# Patient Record
Sex: Female | Born: 1970 | Race: White | Hispanic: No | Marital: Married | Smoking: Never smoker
Health system: Southern US, Community
[De-identification: ages and names within clinical notes are randomized; demographics above are authoritative.]

## PROBLEM LIST (undated history)

## (undated) DIAGNOSIS — G459 Transient cerebral ischemic attack, unspecified: Secondary | ICD-10-CM

## (undated) HISTORY — PX: NO PAST SURGERIES: SHX2092

---

## 2018-09-26 ENCOUNTER — Observation Stay (HOSPITAL_COMMUNITY): Payer: PRIVATE HEALTH INSURANCE

## 2018-09-26 ENCOUNTER — Emergency Department (HOSPITAL_COMMUNITY): Payer: PRIVATE HEALTH INSURANCE

## 2018-09-26 ENCOUNTER — Other Ambulatory Visit: Payer: Self-pay

## 2018-09-26 ENCOUNTER — Inpatient Hospital Stay (HOSPITAL_COMMUNITY)
Admission: EM | Admit: 2018-09-26 | Discharge: 2018-09-28 | DRG: 065 | Disposition: A | Discharge status: Home / Self Care | Payer: PRIVATE HEALTH INSURANCE | Attending: Internal Medicine | Admitting: Internal Medicine

## 2018-09-26 ENCOUNTER — Encounter (HOSPITAL_COMMUNITY): Payer: Self-pay | Admitting: Emergency Medicine

## 2018-09-26 DIAGNOSIS — I6310 Cerebral infarction due to embolism of unspecified precerebral artery: Secondary | ICD-10-CM

## 2018-09-26 DIAGNOSIS — R29702 NIHSS score 2: Secondary | ICD-10-CM | POA: Diagnosis present

## 2018-09-26 DIAGNOSIS — I6340 Cerebral infarction due to embolism of unspecified cerebral artery: Secondary | ICD-10-CM | POA: Diagnosis not present

## 2018-09-26 DIAGNOSIS — Z833 Family history of diabetes mellitus: Secondary | ICD-10-CM

## 2018-09-26 DIAGNOSIS — Q2112 Patent foramen ovale: Secondary | ICD-10-CM

## 2018-09-26 DIAGNOSIS — R29810 Facial weakness: Secondary | ICD-10-CM | POA: Diagnosis present

## 2018-09-26 DIAGNOSIS — R4781 Slurred speech: Secondary | ICD-10-CM | POA: Diagnosis present

## 2018-09-26 DIAGNOSIS — I6782 Cerebral ischemia: Secondary | ICD-10-CM | POA: Diagnosis present

## 2018-09-26 DIAGNOSIS — E785 Hyperlipidemia, unspecified: Secondary | ICD-10-CM | POA: Diagnosis present

## 2018-09-26 DIAGNOSIS — R202 Paresthesia of skin: Secondary | ICD-10-CM | POA: Diagnosis not present

## 2018-09-26 DIAGNOSIS — R200 Anesthesia of skin: Secondary | ICD-10-CM | POA: Diagnosis not present

## 2018-09-26 DIAGNOSIS — Q211 Atrial septal defect: Secondary | ICD-10-CM

## 2018-09-26 DIAGNOSIS — G8191 Hemiplegia, unspecified affecting right dominant side: Secondary | ICD-10-CM | POA: Diagnosis present

## 2018-09-26 DIAGNOSIS — G459 Transient cerebral ischemic attack, unspecified: Secondary | ICD-10-CM | POA: Diagnosis present

## 2018-09-26 DIAGNOSIS — E669 Obesity, unspecified: Secondary | ICD-10-CM | POA: Diagnosis not present

## 2018-09-26 DIAGNOSIS — Z6836 Body mass index (BMI) 36.0-36.9, adult: Secondary | ICD-10-CM

## 2018-09-26 DIAGNOSIS — I639 Cerebral infarction, unspecified: Secondary | ICD-10-CM | POA: Diagnosis present

## 2018-09-26 HISTORY — DX: Transient cerebral ischemic attack, unspecified: G45.9

## 2018-09-26 LAB — COMPREHENSIVE METABOLIC PANEL
ALT: 14 U/L (ref 0–44)
AST: 17 U/L (ref 15–41)
Albumin: 3.9 g/dL (ref 3.5–5.0)
Alkaline Phosphatase: 90 U/L (ref 38–126)
Anion gap: 8 (ref 5–15)
BUN: 12 mg/dL (ref 6–20)
CO2: 23 mmol/L (ref 22–32)
Calcium: 8.5 mg/dL — ABNORMAL LOW (ref 8.9–10.3)
Chloride: 107 mmol/L (ref 98–111)
Creatinine, Ser: 0.64 mg/dL (ref 0.44–1.00)
GFR calc Af Amer: >60 mL/min (ref >60)
GFR calc non Af Amer: >60 mL/min (ref >60)
GLUCOSE: 86 mg/dL (ref 70–99)
Potassium: 3.9 mmol/L (ref 3.5–5.1)
Sodium: 138 mmol/L (ref 135–145)
Total Bilirubin: 0.2 mg/dL — ABNORMAL LOW (ref 0.3–1.2)
Total Protein: 7.5 g/dL (ref 6.5–8.1)

## 2018-09-26 LAB — CBC WITH DIFFERENTIAL/PLATELET
Abs Immature Granulocytes: 0.06 10*3/uL (ref 0.00–0.07)
Basophils Absolute: 0.1 10*3/uL (ref 0.0–0.1)
Basophils Relative: 1 %
EOS ABS: 0.1 10*3/uL (ref 0.0–0.5)
Eosinophils Relative: 1 %
HCT: 40 % (ref 36.0–46.0)
Hemoglobin: 12.3 g/dL (ref 12.0–15.0)
Immature Granulocytes: 1 %
Lymphocytes Relative: 30 %
Lymphs Abs: 3.1 10*3/uL (ref 0.7–4.0)
MCH: 27.6 pg (ref 26.0–34.0)
MCHC: 30.8 g/dL (ref 30.0–36.0)
MCV: 89.7 fL (ref 80.0–100.0)
Monocytes Absolute: 0.5 10*3/uL (ref 0.1–1.0)
Monocytes Relative: 5 %
Neutro Abs: 6.6 10*3/uL (ref 1.7–7.7)
Neutrophils Relative %: 62 %
Platelets: 417 10*3/uL — ABNORMAL HIGH (ref 150–400)
RBC: 4.46 MIL/uL (ref 3.87–5.11)
RDW: 14 % (ref 11.5–15.5)
WBC: 10.4 10*3/uL (ref 4.0–10.5)
nRBC: 0 % (ref 0.0–0.2)

## 2018-09-26 LAB — TSH: TSH: 1.885 u[IU]/mL (ref 0.350–4.500)

## 2018-09-26 MED ORDER — ACETAMINOPHEN 325 MG PO TABS: 650.0000 mg | ORAL_TABLET | ORAL | Status: DC | PRN

## 2018-09-26 MED ORDER — ASPIRIN 325 MG PO TABS
325.0000 mg | ORAL_TABLET | Freq: Every day | ORAL | Status: DC
  Administered 2018-09-26 – 2018-09-27 (×2): 325 mg via ORAL
  Filled 2018-09-26 (×2): qty 1

## 2018-09-26 MED ORDER — SENNOSIDES-DOCUSATE SODIUM 8.6-50 MG PO TABS: 1.0000 | ORAL_TABLET | Freq: Every evening | ORAL | Status: DC | PRN

## 2018-09-26 MED ORDER — ACETAMINOPHEN 160 MG/5ML PO SOLN: 650.0000 mg | ORAL | Status: DC | PRN

## 2018-09-26 MED ORDER — ACETAMINOPHEN 650 MG RE SUPP: 650.0000 mg | RECTAL | Status: DC | PRN

## 2018-09-26 MED ORDER — ENOXAPARIN SODIUM 40 MG/0.4ML ~~LOC~~ SOLN
40.0000 mg | SUBCUTANEOUS | Status: DC
  Administered 2018-09-27: 40 mg via SUBCUTANEOUS
  Filled 2018-09-26 (×2): qty 0.4

## 2018-09-26 MED ORDER — ATORVASTATIN CALCIUM 40 MG PO TABS
40.0000 mg | ORAL_TABLET | Freq: Every day | ORAL | Status: DC
  Administered 2018-09-27 – 2018-09-28 (×2): 40 mg via ORAL
  Filled 2018-09-26 (×2): qty 1

## 2018-09-26 MED ORDER — STROKE: EARLY STAGES OF RECOVERY BOOK
Freq: Once | Status: AC
  Administered 2018-09-27: 05:00:00

## 2018-09-26 NOTE — ED Triage Notes (Signed)
Patient states started having some slurred speech and difficult forming her words last night around 10:30pm. Per patient woke this morning at 8:30-9:00 with numbness and tingling to right side of body. Patient denies weakness but states "Feels like my arm and leg are asleep." Patient ambulates and gait steady. Patient does Drag right foot with ambulation. Slight right side face drooping noted. Patient reports hx of TIA that affected left hand that had similar symptoms.

## 2018-09-26 NOTE — H&P (Signed)
TRH H&P   Patient Demographics:    Kristie Sweeney, is a 48 y.o. female  MRN: [id number]   DOB - 1971/06/20  Admit Date - 09/26/2018  Outpatient Primary MD for the patient is Patient, No Pcp Per  Referring MD: Dr. Adriana Simas  Outpatient Specialists: None  Patient coming from: Home  Chief Complaint  Patient presents with   Aphasia      HPI:    Kristie Sweeney  is a 48 y.o. 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  Patient had some slurred speech last night.  She went to bed and when she woke up this morning she again had some slurred speech which shortly went away.  She then had tingling and numbness in her right arm and forearm and with concern for strokelike symptoms she came to the ED.  While getting out of the car she felt that her right leg was dragging.  She denies any headache, blurred vision, dizziness, fever, chills, nausea, vomiting, chest pain, palpitations, shortness of breath, dysuria or diarrhea.  No recent illness, sick contact or fall.  She reports having few episodes of dizziness last week. Denies being on aspirin or any medication.  She reports that her previous stroke was suspected to be due to being on birth control pills.  Reports currently being on an implantable contraceptive. Denies tobacco use, alcohol use or any illicit drug use.  Course in the ED Vitals were stable.  Blood work was unremarkable.  CT scan was negative for any acute findings but commented on low density in the subcortical white matter of the left frontal lobe which could be related to a recent versus old ischemic insult. ED physician Dr. Adriana Simas spoke with neuro hospitalist at [facility] who recommended getting an MRI  to rule out possible stroke. During my evaluation patient reported occasional numbness of her right arm which had mostly resolved.  She cleared bedside swallow evaluation. Decided to place on observation at [facility] and obtain MRI tomorrow morning followed by neurology consult..     Review of systems:    In addition to the HPI above, No Fever-chills, No Headache, No changes with Vision or hearing, No problems swallowing food or Liquids, No Chest pain, Cough or Shortness of Breath, No Abdominal pain, No Nausea or vomiting, Bowel movements are regular, No Blood in stool or Urine, No dysuria, No new skin rashes or bruises, No new joints pains-aches,  Right-sided numbness, tingling and heaviness, slurred speech No recent weight gain or loss, No polyuria, polydypsia or polyphagia, No significant Mental Stressors.  A full 10 point Review of Systems was done, except as stated above, all other Review of Systems were negative.   With Past History  of the following :    Past Medical History:  Diagnosis Date   TIA (transient ischemic attack)       No past surgical history   Social History:     Social History   Tobacco Use   Smoking status: Never Smoker   Smokeless tobacco: Never Used  Substance Use Topics   Alcohol use: Never    Frequency: Never     Lives -home with husband  Mobility -independent     Family History :     Family History  Problem Relation Age of Onset   Diabetes Mother       Home Medications:   Prior to Admission medications   Medication Sig Start Date End Date Taking? Authorizing Provider  ibuprofen (ADVIL,MOTRIN) 200 MG tablet Take 400 mg by mouth every 6 (six) hours as needed.   Yes [provider]     Allergies:    No Known Allergies   Physical Exam:   Vitals  Blood pressure 119/78, pulse 86, temperature 98.2 F (36.8 C), temperature source Oral, resp. rate 18, height 5\' 2"  (1.575 m), weight 90.7 kg, last  menstrual period 09/26/2018, SpO2 97 %.   General: Middle-aged obese female lying in bed in no acute distress HEENT: Pupils reactive bilaterally, EOMI, no pallor, no icterus, moist mucosa, supple neck, no cervical lymphadenopathy Chest: Clear to auscultation bilaterally, no added sound CVS: Normal S1-S2, no murmurs rub or gallop GI: Soft, nondistended, nontender, bowel sounds present Musculoskeletal: Warm, no edema CNS: Alert and oriented x3, normal motor tone power and reflexes bilaterally, normal sensation in all extremities bilaterally, plantar downgoing bilaterally, no pronator drift, no cerebellar signs, gait normal.   Data Review:    CBC Recent Labs  Lab 09/26/18 1138  WBC 10.4  HGB 12.3  HCT 40.0  PLT 417*  MCV 89.7  MCH 27.6  MCHC 30.8  RDW 14.0  LYMPHSABS 3.1  MONOABS 0.5  EOSABS 0.1  BASOSABS 0.1   ------------------------------------------------------------------------------------------------------------------  Chemistries  Recent Labs  Lab 09/26/18 1138  NA 138  K 3.9  CL 107  CO2 23  GLUCOSE 86  BUN 12  CREATININE 0.64  CALCIUM 8.5*  AST 17  ALT 14  ALKPHOS 90  BILITOT 0.2*   ------------------------------------------------------------------------------------------------------------------ estimated creatinine clearance is 89 mL/min (by C-G formula based on SCr of 0.64 mg/dL). ------------------------------------------------------------------------------------------------------------------ No results for input(s): TSH, T4TOTAL, T3FREE, THYROIDAB in the last 72 hours.  Invalid input(s): FREET3  Coagulation profile No results for input(s): INR, PROTIME in the last 168 hours. ------------------------------------------------------------------------------------------------------------------- No results for input(s): DDIMER in the last 72  hours. -------------------------------------------------------------------------------------------------------------------  Cardiac Enzymes No results for input(s): CKMB, TROPONINI, MYOGLOBIN in the last 168 hours.  Invalid input(s): CK ------------------------------------------------------------------------------------------------------------------ No results found for: BNP   ---------------------------------------------------------------------------------------------------------------  Urinalysis No results found for: COLORURINE, APPEARANCEUR, LABSPEC, PHURINE, GLUCOSEU, HGBUR, BILIRUBINUR, KETONESUR, PROTEINUR, UROBILINOGEN, NITRITE, LEUKOCYTESUR  ----------------------------------------------------------------------------------------------------------------   Imaging Results:    Ct Head Wo Contrast  Result Date: 09/26/2018 CLINICAL DATA:  Slurred speech beginning 2230 hours last night. Numbness in the right side of the body. EXAM: CT HEAD WITHOUT CONTRAST TECHNIQUE: Contiguous axial images were obtained from the base of the skull through the vertex without intravenous contrast. COMPARISON:  None. FINDINGS: Brain: The brainstem and cerebellum are normal. Right cerebral hemisphere appears normal. There is low-density in the subcortical white matter of the left frontal lobe. The overlying cortex appears normal. This could be a recent or old ischemic insult. Possibility of vasogenic edema does  exist as well. Consider brain MRI for more accurate evaluation. No sign of hemorrhage, hydrocephalus or extra-axial collection. Vascular: No abnormal vascular finding. Skull: Negative Sinuses/Orbits: Clear/normal Other: None IMPRESSION: Abnormal low-density in the left frontal subcortical white matter. This is nonspecific and could represent an old or recent ischemic insult. Vasogenic edema not completely excluded. Consider MRI for more accurate evaluation. Electronically Signed   By: Paulina FusiMark  Shogry M.D.    On: 09/26/2018 12:23    My personal review of EKG: Normal sinus rhythm at 82, no ST-T changes.  Assessment & Plan:    Active Problems:   TIA (transient ischemic attack) Symptoms appear to have mostly resolved.  No focal deficit appreciated on exam.  Head CT without acute findings although cannot rule out any recent versus old ischemic findings on left frontal lobe. Observe on telemetry. Obtain MRI of the brain/MRA head.  This will be done in the morning.  Further work-up including echo and carotid ultrasound if MRI positive.  Neurology consult in the morning. Placed on full dose aspirin (325 mg daily).  Add Lipitor 40 mg daily.  Check lipid panel and A1c in the morning.  Check urine drug screen, TSH and HIV antibody. PT evaluation.    Obesity (BMI 30-39.9) Needs counseling on weight loss and exercise.      DVT Prophylaxis: Subcu Lovenox  AM Labs Ordered, also please review Full Orders  Family Communication: Admission, patients condition and plan of care including tests being ordered have been discussed with patient and husband at bedside   Code Status full code  Likely DC to home tomorrow after work-up completed  Condition: Fair  Consults called: Neurology  Admission status: Observation on telemetry  Time spent in minutes : 45   Calli Bashor M.D on 09/26/2018 at 1:45 PM  Between 7am to 7pm - Pager - [phone]. After 7pm go to [email or web address] - password [email or web address]  [facility] - Office  [phone]

## 2018-09-26 NOTE — ED Provider Notes (Signed)
[facility] EMERGENCY DEPARTMENT Provider Note   CSN: [id number] Arrival date & time: 09/26/18  1012    History   Chief Complaint Chief Complaint  Patient presents with   Aphasia    HPI [name] is a 48 y.o. female.     Level 5 caveat for urgent need for intervention.  Patient reports right arm and right leg numbness since 2230 last night with associated questionable slurred speech.  She says her arms and legs are "asleep".  She was able to ambulate from home to the car, then to the hospital.  She apparently drags her right foot.  Questionable right facial drooping.  Past medical history includes TIA with left-sided involvement.  No known history of diabetes or hypertension.     Past Medical History:  Diagnosis Date   TIA (transient ischemic attack)     There are no active problems to display for this patient.   History reviewed. No pertinent surgical history.   OB History    Gravida  2   Para  2   Term  2   Preterm      AB      Living  2     SAB      TAB      Ectopic      Multiple      Live Births               Home Medications    Prior to Admission medications   Medication Sig Start Date End Date Taking? Authorizing Provider  ibuprofen (ADVIL,MOTRIN) 200 MG tablet Take 400 mg by mouth every 6 (six) hours as needed.   Yes [provider]    Family History Family History  Problem Relation Age of Onset   Diabetes Mother     Social History Social History   Tobacco Use   Smoking status: Never Smoker   Smokeless tobacco: Never Used  Substance Use Topics   Alcohol use: Never    Frequency: Never   Drug use: Never     Allergies   Patient has no known allergies.   Review of Systems Review of Systems  Unable to perform ROS: Acuity of condition     Physical Exam Updated Vital Signs BP 119/78    Pulse 86    Temp 98.2 F (36.8 C) (Oral)    Resp 18    Ht 5\' 2"  (1.575 m)    Wt 90.7 kg    LMP 09/26/2018     SpO2 97%    BMI 36.58 kg/m   Physical Exam Vitals signs and nursing note reviewed.  Constitutional:      Appearance: She is well-developed.  HENT:     Head: Normocephalic and atraumatic.  Eyes:     Conjunctiva/sclera: Conjunctivae normal.  Neck:     Musculoskeletal: Neck supple.  Cardiovascular:     Rate and Rhythm: Normal rate and regular rhythm.  Pulmonary:     Effort: Pulmonary effort is normal.     Breath sounds: Normal breath sounds.  Abdominal:     General: Bowel sounds are normal.     Palpations: Abdomen is soft.  Musculoskeletal: Normal range of motion.  Skin:    General: Skin is warm and dry.  Neurological:     Mental Status: She is alert and oriented to person, place, and time.     Comments: Able to move her arms and legs; questionable right side weaker than left side.  No obvious facial asymmetry  noted.  Psychiatric:        Behavior: Behavior normal.      ED Treatments / Results  Labs (all labs ordered are listed, but only abnormal results are displayed) Labs Reviewed  CBC WITH DIFFERENTIAL/PLATELET - Abnormal; Notable for the following components:      Result Value   Platelets 417 (*)    All other components within normal limits  COMPREHENSIVE METABOLIC PANEL - Abnormal; Notable for the following components:   Calcium 8.5 (*)    Total Bilirubin 0.2 (*)    All other components within normal limits    EKG EKG Interpretation  Date/Time:  Sunday September 26 2018 10:35:45 EDT Ventricular Rate:  82 PR Interval:    QRS Duration: 76 QT Interval:  404 QTC Calculation: 472 R Axis:   2 Text Interpretation:  Sinus rhythm Low voltage, precordial leads Borderline T wave abnormalities Baseline wander in lead(s) V1 Confirmed by Donnetta Hutching [phone]) on 09/26/2018 11:41:45 AM   Radiology Ct Head Wo Contrast  Result Date: 09/26/2018 CLINICAL DATA:  Slurred speech beginning 2230 hours last night. Numbness in the right side of the body. EXAM: CT HEAD WITHOUT CONTRAST  TECHNIQUE: Contiguous axial images were obtained from the base of the skull through the vertex without intravenous contrast. COMPARISON:  None. FINDINGS: Brain: The brainstem and cerebellum are normal. Right cerebral hemisphere appears normal. There is low-density in the subcortical white matter of the left frontal lobe. The overlying cortex appears normal. This could be a recent or old ischemic insult. Possibility of vasogenic edema does exist as well. Consider brain MRI for more accurate evaluation. No sign of hemorrhage, hydrocephalus or extra-axial collection. Vascular: No abnormal vascular finding. Skull: Negative Sinuses/Orbits: Clear/normal Other: None IMPRESSION: Abnormal low-density in the left frontal subcortical white matter. This is nonspecific and could represent an old or recent ischemic insult. Vasogenic edema not completely excluded. Consider MRI for more accurate evaluation. Electronically Signed   By: Paulina Fusi M.D.   On: 09/26/2018 12:23    Procedures Procedures (including critical care time)  Medications Ordered in ED Medications - No data to display   Initial Impression / Assessment and Plan / ED Course  I have reviewed the triage vital signs and the nursing notes.  Pertinent labs & imaging results that were available during my care of the patient were reviewed by me and considered in my medical decision making (see chart for details).        Patient presents with right arm and right leg numbness since 2230 last night.  CT scan shows no obvious acute findings.  MRI recommended.  Discussed with tele-neurologist at [facility].  He recommended an MRI.  No further consult will be required if MRI is negative.  Will discuss with hospitalist for transfer.  Final Clinical Impressions(s) / ED Diagnoses   Final diagnoses:  Numbness and tingling of right leg  Numbness and tingling of right arm    ED Discharge Orders    None       Donnetta Hutching, MD 09/26/18 1257

## 2018-09-26 NOTE — Progress Notes (Signed)
Patient arrives to room 318 from ED via wheelchair at this time.  Patient independent from wheelchair to bed.

## 2018-09-27 ENCOUNTER — Observation Stay (HOSPITAL_COMMUNITY): Payer: PRIVATE HEALTH INSURANCE

## 2018-09-27 ENCOUNTER — Encounter (HOSPITAL_COMMUNITY): Payer: Self-pay

## 2018-09-27 ENCOUNTER — Observation Stay (HOSPITAL_BASED_OUTPATIENT_CLINIC_OR_DEPARTMENT_OTHER): Payer: PRIVATE HEALTH INSURANCE

## 2018-09-27 DIAGNOSIS — I6310 Cerebral infarction due to embolism of unspecified precerebral artery: Secondary | ICD-10-CM

## 2018-09-27 DIAGNOSIS — R202 Paresthesia of skin: Secondary | ICD-10-CM | POA: Diagnosis not present

## 2018-09-27 DIAGNOSIS — E785 Hyperlipidemia, unspecified: Secondary | ICD-10-CM

## 2018-09-27 DIAGNOSIS — I639 Cerebral infarction, unspecified: Secondary | ICD-10-CM | POA: Diagnosis present

## 2018-09-27 DIAGNOSIS — G459 Transient cerebral ischemic attack, unspecified: Secondary | ICD-10-CM | POA: Diagnosis not present

## 2018-09-27 DIAGNOSIS — R200 Anesthesia of skin: Secondary | ICD-10-CM | POA: Diagnosis not present

## 2018-09-27 LAB — LIPID PANEL
CHOLESTEROL: 178 mg/dL (ref 0–200)
HDL: 47 mg/dL (ref >40)
LDL Cholesterol: 100 mg/dL — ABNORMAL HIGH (ref 0–99)
Total CHOL/HDL Ratio: 3.8 RATIO
Triglycerides: 156 mg/dL — ABNORMAL HIGH (ref <150)
VLDL: 31 mg/dL (ref 0–40)

## 2018-09-27 LAB — HEMOGLOBIN A1C
Hgb A1c MFr Bld: 5.7 % — ABNORMAL HIGH (ref 4.8–5.6)
MEAN PLASMA GLUCOSE: 116.89 mg/dL

## 2018-09-27 LAB — RAPID URINE DRUG SCREEN, HOSP PERFORMED
Amphetamines: NOT DETECTED
Barbiturates: NOT DETECTED
Benzodiazepines: NOT DETECTED
Cocaine: NOT DETECTED
Opiates: NOT DETECTED
Tetrahydrocannabinol: NOT DETECTED

## 2018-09-27 LAB — ECHOCARDIOGRAM COMPLETE
Height: 62 in
Weight: 3224.01 oz

## 2018-09-27 LAB — VITAMIN B12: VITAMIN B 12: 314 pg/mL (ref 180–914)

## 2018-09-27 LAB — SEDIMENTATION RATE: SED RATE: 18 mm/h (ref 0–22)

## 2018-09-27 NOTE — Progress Notes (Signed)
SLP Cancellation Note  Patient Details Name: Kristie Sweeney MRN: [id number] DOB: 1971/07/09   Cancelled treatment:       Reason Eval/Treat Not Completed: SLP screened, no needs identified, will sign off. Pt is presenting at cognitive-linguistic baseline.   Thank you, Amelia H. Romie Levee, CCC-SLP Speech Language Pathologist    Kristie Sweeney 09/27/2018, 8:20 AM

## 2018-09-27 NOTE — Consult Note (Signed)
[facility] Anitha Kreiser A. Gerilyn Pilgrim, MD     [email or web address]          [name] is an 48 y.o. female.   ASSESSMENT/PLAN: 1.  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  For now continue with aspirin 325 and DVT prophylaxis.  Also additional labs will be obtained including for homocystine level, RPR, C-reactive protein and ANA.  2.  Multiple bilateral remote infarct: Work-up as above.    The patient is a 48 year old ambidextrous white female who presents with a 1 day history of acute dizziness, difficulty speaking/aphasia and right-sided weakness.  The symptoms lasted for a few hours and then resolved.  She tells me that about 9 years ago she had episode of numbness and tingling along with weakness involving the left side.  That event lasted for about the 90 days.  She was told that she may have had a "TIA".  She was to be on aspirin but apparently took it for about a year and then discontinued taking this medication.  She denies any chronic illness such as hypertension, diabetes or heart disease.  Patient denies any episodes of palpitation or shortness of breath.  She otherwise apparently has been relatively healthy.  The review of systems otherwise negative.    GENERAL: This a pleasant obese female who is in no acute distress.   HEENT: Neck is supple no trauma appreciated.  ABDOMEN: soft  EXTREMITIES: No edema   BACK: Normal  SKIN: Normal by inspection.    MENTAL STATUS: Alert and oriented. Speech, language and cognition are generally intact. Judgment and insight normal.   CRANIAL NERVES: Pupils are equal, round and reactive to light and accomodation; extra ocular movements are full, there is no significant nystagmus; visual fields are full; upper and lower facial muscles are normal in strength and symmetric, there is no flattening of the  nasolabial folds; tongue is midline; uvula is midline; shoulder elevation is normal.  MOTOR: Normal tone, bulk and strength; no pronator drift.  Mild drift of the legs bilaterally.  COORDINATION: Left finger to nose is normal, right finger to nose is normal, No rest tremor; no intention tremor; no postural tremor; no bradykinesia.  REFLEXES: Deep tendon reflexes are symmetrical and normal.   SENSATION: Normal to light touch, temperature, and pain.      NIH stroke scale 1, 1 total 2.   Blood pressure 114/72, pulse (!) 101, temperature 98.4 F (36.9 C), temperature source Oral, resp. rate 18, height  (1.575 m), weight 91.4 kg, last menstrual period 09/26/2018, SpO2 96 %.  Past Medical History:  Diagnosis Date  . TIA (transient ischemic attack)     History reviewed. No pertinent surgical history.  Family History  Problem Relation Age of Onset  . Diabetes Mother     Social History:  reports that she has never smoked. She has never used smokeless tobacco. She reports that she does not drink alcohol or use drugs.  Allergies: No Known Allergies  Medications: Prior to Admission medications   Medication Sig Start Date End Date Taking? Authorizing Provider  ibuprofen (ADVIL,MOTRIN) 200 MG tablet Take 400 mg by mouth every 6 (six) hours as needed.   Yes [provider]    Scheduled Meds: . aspirin  325 mg Oral Daily  . atorvastatin  40 mg Oral q1800  . enoxaparin (LOVENOX) injection  40 mg Subcutaneous Q24H  Continuous Infusions: PRN Meds:.acetaminophen **OR** acetaminophen (TYLENOL) oral liquid 160 mg/5 mL **OR** acetaminophen, senna-docusate     Results for orders placed or performed during the hospital encounter of 09/26/18 (from the past 48 hour(s))  CBC with Differential     Status: Abnormal   Collection Time: 09/26/18 11:38 AM  Result Value Ref Range   WBC 10.4 4.0 - 10.5 K/uL   RBC 4.46 3.87 - 5.11 MIL/uL   Hemoglobin 12.3 12.0 - 15.0 g/dL   HCT  32.4 40.1 - 02.7 %   MCV 89.7 80.0 - 100.0 fL   MCH 27.6 26.0 - 34.0 pg   MCHC 30.8 30.0 - 36.0 g/dL   RDW 25.3 66.4 - 40.3 %   Platelets 417 (H) 150 - 400 K/uL   nRBC 0.0 0.0 - 0.2 %   Neutrophils Relative % 62 %   Neutro Abs 6.6 1.7 - 7.7 K/uL   Lymphocytes Relative 30 %   Lymphs Abs 3.1 0.7 - 4.0 K/uL   Monocytes Relative 5 %   Monocytes Absolute 0.5 0.1 - 1.0 K/uL   Eosinophils Relative 1 %   Eosinophils Absolute 0.1 0.0 - 0.5 K/uL   Basophils Relative 1 %   Basophils Absolute 0.1 0.0 - 0.1 K/uL   Immature Granulocytes 1 %   Abs Immature Granulocytes 0.06 0.00 - 0.07 K/uL    Comment: Performed at [facility], [address], [place], [place] [address]  Comprehensive metabolic panel     Status: Abnormal   Collection Time: 09/26/18 11:38 AM  Result Value Ref Range   Sodium 138 135 - 145 mmol/L   Potassium 3.9 3.5 - 5.1 mmol/L   Chloride 107 98 - 111 mmol/L   CO2 23 22 - 32 mmol/L   Glucose, Bld 86 70 - 99 mg/dL   BUN 12 6 - 20 mg/dL   Creatinine, Ser 9.56 0.44 - 1.00 mg/dL   Calcium 8.5 (L) 8.9 - 10.3 mg/dL   Total Protein 7.5 6.5 - 8.1 g/dL   Albumin 3.9 3.5 - 5.0 g/dL   AST 17 15 - 41 U/L   ALT 14 0 - 44 U/L   Alkaline Phosphatase 90 38 - 126 U/L   Total Bilirubin 0.2 (L) 0.3 - 1.2 mg/dL   GFR calc non Af Amer >60 >60 mL/min   GFR calc Af Amer >60 >60 mL/min   Anion gap 8 5 - 15    Comment: Performed at [facility], [address] [place], [place] [address]  TSH     Status: None   Collection Time: 09/26/18 11:38 AM  Result Value Ref Range   TSH 1.885 0.350 - 4.500 uIU/mL    Comment: Performed by a 3rd Generation assay with a functional sensitivity of <=0.01 uIU/mL. Performed at [facility], [address], [place], [place] [address]   Hemoglobin A1c     Status: Abnormal   Collection Time: 09/27/18  5:39 AM  Result Value Ref Range   Hgb A1c MFr Bld 5.7 (H) 4.8 - 5.6 %    Comment: (NOTE) Pre diabetes:          5.7%-6.4% Diabetes:              >6.4% Glycemic  control for   <7.0% adults with diabetes    Mean Plasma Glucose 116.89 mg/dL    Comment: Performed at [facility], [address] [place], [place] [address]  Lipid panel     Status: Abnormal   Collection  Time: 09/27/18  5:39 AM  Result Value Ref Range   Cholesterol 178 0 - 200 mg/dL   Triglycerides 300 (H) <150 mg/dL   HDL 47 >51 mg/dL   Total CHOL/HDL Ratio 3.8 RATIO   VLDL 31 0 - 40 mg/dL   LDL Cholesterol 102 (H) 0 - 99 mg/dL    Comment:        Total Cholesterol/HDL:CHD Risk Coronary Heart Disease Risk Table                     Men   Women  1/2 Average Risk   3.4   3.3  Average Risk       5.0   4.4  2 X Average Risk   9.6   7.1  3 X Average Risk  23.4   11.0        Use the calculated Patient Ratio above and the CHD Risk Table to determine the patient's CHD Risk.        ATP III CLASSIFICATION (LDL):  <100     mg/dL   Optimal  111-735  mg/dL   Near or Above                    Optimal  130-159  mg/dL   Borderline  670-141  mg/dL   High  >030     mg/dL   Very High Performed at [facility], [address] [place], [place] [address]   Urine rapid drug screen (hosp performed)     Status: None   Collection Time: 09/27/18  6:30 AM  Result Value Ref Range   Opiates NONE DETECTED NONE DETECTED   Cocaine NONE DETECTED NONE DETECTED   Benzodiazepines NONE DETECTED NONE DETECTED   Amphetamines NONE DETECTED NONE DETECTED   Tetrahydrocannabinol NONE DETECTED NONE DETECTED   Barbiturates NONE DETECTED NONE DETECTED    Comment: (NOTE) DRUG SCREEN FOR MEDICAL PURPOSES ONLY.  IF CONFIRMATION IS NEEDED FOR ANY PURPOSE, NOTIFY LAB WITHIN 5 DAYS. LOWEST DETECTABLE LIMITS FOR URINE DRUG SCREEN Drug Class                     Cutoff (ng/mL) Amphetamine and metabolites    1000 Barbiturate and metabolites    200 Benzodiazepine                 200 Tricyclics and metabolites     300 Opiates and metabolites        300 Cocaine and metabolites        300 THC                             50 Performed at [facility], [address], [place], [place] [address]     Studies/Results:  ECHO FINDINGS  Left Ventricle: The left ventricle has normal systolic function, with an ejection fraction of 55-60%. The cavity size was normal. There is no increase in left ventricular wall thickness. Left ventricular diastolic parameters were normal. Right Ventricle: The right ventricle has normal systolic function. The cavity was normal. There is no increase in right ventricular wall thickness. Left Atrium: left atrial size was normal in size Right Atrium: right atrial size was normal in size. Right atrial pressure is estimated at 3 mmHg. Interatrial Septum: No atrial level shunt detected by color flow Doppler. Pericardium: There is no evidence of pericardial effusion. Mitral Valve: The mitral valve is normal in structure.  Mitral valve regurgitation is not visualized by color flow Doppler. Tricuspid Valve: The tricuspid valve is normal in structure. Tricuspid valve regurgitation is trivial by color flow Doppler. Aortic Valve: The aortic valve is normal in structure. Aortic valve regurgitation was not visualized by color flow Doppler. Pulmonic Valve: The pulmonic valve was not well visualized. Pulmonic valve regurgitation is not visualized by color flow Doppler. Venous: The inferior vena cava is normal in size with greater than 50% respiratory variability.      BRAIN MRI MRA FINDINGS: MRI HEAD FINDINGS  Brain: Several small areas of acute infarct in the left frontal lobe involving Broca's area and extending superiorly. This is separate from the hypodensity in the left frontal white matter on CT which is consistent with adjacent area of chronic ischemia.  Negative for hemorrhage or mass. Small chronic infarct in the right posterior frontal lobe and right frontal white matter. Brainstem and cerebellum normal.  Vascular: Normal arterial flow void  Skull and upper cervical  spine: Negative  Sinuses/Orbits: Mild mucosal edema paranasal sinuses.  Normal orbit  Other: None  MRA HEAD FINDINGS  Both vertebral arteries widely patent. Basilar widely patent. Left PICA patent. Dominant right AICA patent. Superior cerebellar and posterior cerebral arteries patent bilaterally.  Internal carotid artery widely patent bilaterally. Anterior and middle cerebral arteries widely patent bilaterally. Negative for cerebral aneurysm.  IMPRESSION: Scattered small areas of acute infarct in the left frontal lobe involving Broca's area. Mild chronic ischemia elsewhere. Negative for hemorrhage  Negative MRA head     The brain MRI is reviewed in person.  There is a few small subcortical infarct involving the left frontal region.  There are embolic appearing with several small lesions.  This is associated with increased signal on SWI.  FLAIR imaging shows evidence of prior infarct involving right frontal region.  There is a small superior cortical lesion in the larger frontal temporal lesion which is associated with encephalomalacia.  There is also increased signal involving the left frontal horn associated with reduced signal on T1 all these are consistent with remote infarcts.  No hemorrhages appreciated.  No other white matter lesions.  No intracranial atherosclerosis is appreciated.    Amaira Safley A. Gerilyn Pilgrimoonquah, M.D.  Diplomate, Biomedical engineerAmerican Board of Psychiatry and Neurology ( Neurology). 09/27/2018, 7:16 PM

## 2018-09-27 NOTE — Progress Notes (Signed)
Patient is refusing Lovenox until after MRI results. Risk/Benefits explained with verbal understanding.

## 2018-09-27 NOTE — Progress Notes (Signed)
PROGRESS NOTE    [name]  DPO:[id number] DOB: 1970-07-16 DOA: 09/26/2018 PCP: Patient, No Pcp Per    Brief Narrative:  48 year old female with a history of TIA proximally 8 years ago, presented to the hospital with slurring of speech and right-sided weakness/numbness.  MRI brain confirmed left-sided CVA.  She has been seen by neurology and received stroke work-up.   Assessment & Plan:   Principal Problem:   CVA (cerebral vascular accident) (HCC) Active Problems:   Obesity (BMI 30-39.9)   Hyperlipidemia   1. Acute CVA.  MRI brain shows multiple small strokes involving the left frontal region.  Seen by neurology who has high suspicion for cardioembolic phenomenon.  Carotid Dopplers and 2D echocardiogram unrevealing.  TEE with bubble study and 30-day event monitor has been recommended.  She is continued on aspirin.  Will request cardiology evaluation to see if TEE can be done in a.m.  We will keep n.p.o. after midnight. 2. Hyperlipidemia.  LDL was above goal.  She was started on Lipitor.   DVT prophylaxis: Lovenox Code Status: Full code Family Communication: Discussed with husband at the bedside Disposition Plan: Discharge home once work-up is complete   Consultants:   Neurology  Procedures:     Antimicrobials:       Subjective: Feeling better.  Right leg weakness is better.  Right hand paresthesias are improving.  Speech has normalized.  Objective: Vitals:   09/27/18 0730 09/27/18 1117 09/27/18 1539 09/27/18 1930  BP: 120/76 118/73 114/72 111/66  Pulse: 81 74 (!) 101 91  Resp: 18 19 18    Temp: 98.6 F (37 C) (!) 97.5 F (36.4 C) 98.4 F (36.9 C) 98.6 F (37 C)  TempSrc: Oral Oral Oral Oral  SpO2: 97% 97% 96% 97%  Weight:      Height:        Intake/Output Summary (Last 24 hours) at 09/27/2018 2102 Last data filed at 09/27/2018 0600 Gross per 24 hour  Intake 240 ml  Output 100 ml  Net 140 ml   Filed Weights   09/26/18 1026 09/26/18 1852    Weight: 90.7 kg 91.4 kg    Examination:  General exam: Appears calm and comfortable  Respiratory system: Clear to auscultation. Respiratory effort normal. Cardiovascular system: S1 & S2 heard, RRR. No JVD, murmurs, rubs, gallops or clicks. No pedal edema. Gastrointestinal system: Abdomen is nondistended, soft and nontender. No organomegaly or masses felt. Normal bowel sounds heard. Central nervous system: Alert and oriented. No focal neurological deficits. Extremities: Symmetric 5 x 5 power. Skin: No rashes, lesions or ulcers Psychiatry: Judgement and insight appear normal. Mood & affect appropriate.     Data Reviewed: I have personally reviewed following labs and imaging studies  CBC: Recent Labs  Lab 09/26/18 1138  WBC 10.4  NEUTROABS 6.6  HGB 12.3  HCT 40.0  MCV 89.7  PLT 417*   Basic Metabolic Panel: Recent Labs  Lab 09/26/18 1138  NA 138  K 3.9  CL 107  CO2 23  GLUCOSE 86  BUN 12  CREATININE 0.64  CALCIUM 8.5*   GFR: Estimated Creatinine Clearance: 89.4 mL/min (by C-G formula based on SCr of 0.64 mg/dL). Liver Function Tests: Recent Labs  Lab 09/26/18 1138  AST 17  ALT 14  ALKPHOS 90  BILITOT 0.2*  PROT 7.5  ALBUMIN 3.9   No results for input(s): LIPASE, AMYLASE in the last 168 hours. No results for input(s): AMMONIA in the last 168 hours. Coagulation Profile: No results for input(s):  INR, PROTIME in the last 168 hours. Cardiac Enzymes: No results for input(s): CKTOTAL, CKMB, CKMBINDEX, TROPONINI in the last 168 hours. BNP (last 3 results) No results for input(s): PROBNP in the last 8760 hours. HbA1C: Recent Labs    09/27/18 0539  HGBA1C 5.7*   CBG: No results for input(s): GLUCAP in the last 168 hours. Lipid Profile: Recent Labs    09/27/18 0539  CHOL 178  HDL 47  LDLCALC 100*  TRIG 156*  CHOLHDL 3.8   Thyroid Function Tests: Recent Labs    09/26/18 1138  TSH 1.885   Anemia Panel: No results for input(s): VITAMINB12,  FOLATE, FERRITIN, TIBC, IRON, RETICCTPCT in the last 72 hours. Sepsis Labs: No results for input(s): PROCALCITON, LATICACIDVEN in the last 168 hours.  No results found for this or any previous visit (from the past 240 hour(s)).       Radiology Studies: Dg Chest 2 View  Result Date: 09/26/2018 CLINICAL DATA:  Transient ischemic attack. Slight right side facial drooping and numbness and tingling in the right side of the body. EXAM: CHEST - 2 VIEW COMPARISON:  None. FINDINGS: Borderline enlarged cardiac silhouette. Clear lungs with normal vascularity. Unremarkable bones. IMPRESSION: No acute abnormality. Electronically Signed   By: Beckie Salts M.D.   On: 09/26/2018 21:22   Ct Head Wo Contrast  Result Date: 09/26/2018 CLINICAL DATA:  Slurred speech beginning 2230 hours last night. Numbness in the right side of the body. EXAM: CT HEAD WITHOUT CONTRAST TECHNIQUE: Contiguous axial images were obtained from the base of the skull through the vertex without intravenous contrast. COMPARISON:  None. FINDINGS: Brain: The brainstem and cerebellum are normal. Right cerebral hemisphere appears normal. There is low-density in the subcortical white matter of the left frontal lobe. The overlying cortex appears normal. This could be a recent or old ischemic insult. Possibility of vasogenic edema does exist as well. Consider brain MRI for more accurate evaluation. No sign of hemorrhage, hydrocephalus or extra-axial collection. Vascular: No abnormal vascular finding. Skull: Negative Sinuses/Orbits: Clear/normal Other: None IMPRESSION: Abnormal low-density in the left frontal subcortical white matter. This is nonspecific and could represent an old or recent ischemic insult. Vasogenic edema not completely excluded. Consider MRI for more accurate evaluation. Electronically Signed   By: Paulina Fusi M.D.   On: 09/26/2018 12:23   Mr Maxine Glenn Head Wo Contrast  Result Date: 09/27/2018 CLINICAL DATA:  Slurred speech with  right-sided weakness 1 day EXAM: MRI HEAD WITHOUT CONTRAST MRA HEAD WITHOUT CONTRAST TECHNIQUE: Multiplanar, multiecho pulse sequences of the brain and surrounding structures were obtained without intravenous contrast. Angiographic images of the head were obtained using MRA technique without contrast. COMPARISON:  CT head 09/26/2018 FINDINGS: MRI HEAD FINDINGS Brain: Several small areas of acute infarct in the left frontal lobe involving Broca's area and extending superiorly. This is separate from the hypodensity in the left frontal white matter on CT which is consistent with adjacent area of chronic ischemia. Negative for hemorrhage or mass. Small chronic infarct in the right posterior frontal lobe and right frontal white matter. Brainstem and cerebellum normal. Vascular: Normal arterial flow void Skull and upper cervical spine: Negative Sinuses/Orbits: Mild mucosal edema paranasal sinuses.  Normal orbit Other: None MRA HEAD FINDINGS Both vertebral arteries widely patent. Basilar widely patent. Left PICA patent. Dominant right AICA patent. Superior cerebellar and posterior cerebral arteries patent bilaterally. Internal carotid artery widely patent bilaterally. Anterior and middle cerebral arteries widely patent bilaterally. Negative for cerebral aneurysm. IMPRESSION: Scattered small areas  of acute infarct in the left frontal lobe involving Broca's area. Mild chronic ischemia elsewhere. Negative for hemorrhage Negative MRA head Electronically Signed   By: Marlan Palau M.D.   On: 09/27/2018 09:37   Mr Brain Wo Contrast  Result Date: 09/27/2018 CLINICAL DATA:  Slurred speech with right-sided weakness 1 day EXAM: MRI HEAD WITHOUT CONTRAST MRA HEAD WITHOUT CONTRAST TECHNIQUE: Multiplanar, multiecho pulse sequences of the brain and surrounding structures were obtained without intravenous contrast. Angiographic images of the head were obtained using MRA technique without contrast. COMPARISON:  CT head 09/26/2018  FINDINGS: MRI HEAD FINDINGS Brain: Several small areas of acute infarct in the left frontal lobe involving Broca's area and extending superiorly. This is separate from the hypodensity in the left frontal white matter on CT which is consistent with adjacent area of chronic ischemia. Negative for hemorrhage or mass. Small chronic infarct in the right posterior frontal lobe and right frontal white matter. Brainstem and cerebellum normal. Vascular: Normal arterial flow void Skull and upper cervical spine: Negative Sinuses/Orbits: Mild mucosal edema paranasal sinuses.  Normal orbit Other: None MRA HEAD FINDINGS Both vertebral arteries widely patent. Basilar widely patent. Left PICA patent. Dominant right AICA patent. Superior cerebellar and posterior cerebral arteries patent bilaterally. Internal carotid artery widely patent bilaterally. Anterior and middle cerebral arteries widely patent bilaterally. Negative for cerebral aneurysm. IMPRESSION: Scattered small areas of acute infarct in the left frontal lobe involving Broca's area. Mild chronic ischemia elsewhere. Negative for hemorrhage Negative MRA head Electronically Signed   By: Marlan Palau M.D.   On: 09/27/2018 09:37   US Carotid Bilateral (at [facility] And [facility] Only)  Result Date: 09/27/2018 CLINICAL DATA:  Small areas of acute infarct in left frontal lobe based on recent MRI. Slurred speech and right-sided weakness. EXAM: BILATERAL CAROTID DUPLEX ULTRASOUND TECHNIQUE: Wallace Cullens scale imaging, color Doppler and duplex ultrasound were performed of bilateral carotid and vertebral arteries in the neck. COMPARISON:  Brain MRI 09/27/2018 FINDINGS: Criteria: Quantification of carotid stenosis is based on velocity parameters that correlate the residual internal carotid diameter with NASCET-based stenosis levels, using the diameter of the distal internal carotid lumen as the denominator for stenosis measurement. The following velocity measurements were obtained: RIGHT ICA: 111  cm/sec CCA: 97 cm/sec SYSTOLIC ICA/CCA RATIO:  1.2 ECA: 74 cm/sec LEFT ICA: 126 cm/sec CCA: 121 cm/sec SYSTOLIC ICA/CCA RATIO:  1.0 ECA: 78 cm/sec RIGHT CAROTID ARTERY: Right carotid arteries are patent without significant plaque or stenosis. External carotid artery is patent with normal waveform. Normal waveforms and velocities in the internal carotid artery. RIGHT VERTEBRAL ARTERY: Antegrade flow and normal waveform in the right vertebral artery. LEFT CAROTID ARTERY: Left carotid arteries are patent without significant plaque or stenosis. External carotid artery is patent with normal waveform. Normal waveforms and velocities in the internal carotid artery. LEFT VERTEBRAL ARTERY: Antegrade flow and normal waveform in the left vertebral artery. IMPRESSION: Normal carotid artery duplex examination. Carotid arteries are patent without significant plaque or stenosis. Patent vertebral arteries with antegrade flow. Electronically Signed   By: Richarda Overlie M.D.   On: 09/27/2018 10:17        Scheduled Meds:  aspirin  325 mg Oral Daily   atorvastatin  40 mg Oral q1800   enoxaparin (LOVENOX) injection  40 mg Subcutaneous Q24H   Continuous Infusions:   LOS: 0 days    Time spent: 35 minutes    Erick Blinks, MD [facility]   If 7PM-7AM, please contact night-coverage [email or web address]  09/27/2018, 9:02  PM

## 2018-09-27 NOTE — Progress Notes (Signed)
*  PRELIMINARY RESULTS* Echocardiogram 2D Echocardiogram has been performed.  Jeryl Columbia 09/27/2018, 11:48 AM

## 2018-09-27 NOTE — Plan of Care (Signed)

## 2018-09-27 NOTE — Progress Notes (Signed)
OT Screen Note  Patient Details Name: Kristie Sweeney MRN: [id number] DOB: 10-Oct-1970   Cancelled Treatment:    Reason Eval/Treat Not Completed: OT screened, no needs identified, will sign off. Pt was visited by PT earlier this date and patient declined participation in therapy evaluation as she did not want any additional charges on her insurance. Patient reports that she is almost back to baseline. She does not wish for any therapy services. Thank-you for the referral.     Limmie Patricia, OTR/L,CBIS  [phone]  09/27/2018, 3:30 PM

## 2018-09-27 NOTE — Progress Notes (Signed)
PT Cancellation Note  Patient Details Name: Kristie Sweeney MRN: [id number] DOB: 1970-12-05   Cancelled Treatment:    Reason Eval/Treat Not Completed: Patient declined, no reason specified. Pt reports feeling almost back to normal despite R hand feeling "loose or spongy". Pt refuses PT evaluation due to not wanting additional charges on her medical bill despite PT educating pt on benefits/purpose of evaluation. 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   10:23 AM, 09/27/18 Domenick Bookbinder, DPT Physical Therapist with [facility] [phone] office

## 2018-09-27 NOTE — Progress Notes (Signed)
OT Cancellation Note  Patient Details Name: Khamaya First MRN: [id number] DOB: Mar 17, 1971   Cancelled Treatment:    Reason Eval/Treat Not Completed: Patient at procedure or test/ unavailable. Will re-attempt OT evaluation when able.   Limmie Patricia, OTR/L,CBIS  [phone]  09/27/2018, 10:32 AM

## 2018-09-28 ENCOUNTER — Encounter (HOSPITAL_COMMUNITY)
Admission: EM | Disposition: A | Discharge status: Home / Self Care | Payer: Self-pay | Source: Home / Self Care | Attending: Internal Medicine

## 2018-09-28 ENCOUNTER — Observation Stay (HOSPITAL_COMMUNITY): Payer: PRIVATE HEALTH INSURANCE

## 2018-09-28 ENCOUNTER — Encounter (HOSPITAL_COMMUNITY): Payer: Self-pay | Admitting: *Deleted

## 2018-09-28 ENCOUNTER — Inpatient Hospital Stay (HOSPITAL_COMMUNITY): Payer: PRIVATE HEALTH INSURANCE

## 2018-09-28 DIAGNOSIS — I6782 Cerebral ischemia: Secondary | ICD-10-CM | POA: Diagnosis present

## 2018-09-28 DIAGNOSIS — I6389 Other cerebral infarction: Secondary | ICD-10-CM

## 2018-09-28 DIAGNOSIS — Q211 Atrial septal defect: Secondary | ICD-10-CM

## 2018-09-28 DIAGNOSIS — I6310 Cerebral infarction due to embolism of unspecified precerebral artery: Secondary | ICD-10-CM | POA: Diagnosis not present

## 2018-09-28 DIAGNOSIS — G8191 Hemiplegia, unspecified affecting right dominant side: Secondary | ICD-10-CM | POA: Diagnosis present

## 2018-09-28 DIAGNOSIS — E785 Hyperlipidemia, unspecified: Secondary | ICD-10-CM | POA: Diagnosis present

## 2018-09-28 DIAGNOSIS — E669 Obesity, unspecified: Secondary | ICD-10-CM | POA: Diagnosis present

## 2018-09-28 DIAGNOSIS — R4781 Slurred speech: Secondary | ICD-10-CM | POA: Diagnosis present

## 2018-09-28 DIAGNOSIS — G459 Transient cerebral ischemic attack, unspecified: Secondary | ICD-10-CM | POA: Diagnosis present

## 2018-09-28 DIAGNOSIS — R200 Anesthesia of skin: Secondary | ICD-10-CM | POA: Diagnosis present

## 2018-09-28 DIAGNOSIS — Z6836 Body mass index (BMI) 36.0-36.9, adult: Secondary | ICD-10-CM | POA: Diagnosis not present

## 2018-09-28 DIAGNOSIS — Z833 Family history of diabetes mellitus: Secondary | ICD-10-CM | POA: Diagnosis not present

## 2018-09-28 DIAGNOSIS — Q2112 Patent foramen ovale: Secondary | ICD-10-CM

## 2018-09-28 DIAGNOSIS — R29810 Facial weakness: Secondary | ICD-10-CM | POA: Diagnosis present

## 2018-09-28 DIAGNOSIS — I6340 Cerebral infarction due to embolism of unspecified cerebral artery: Secondary | ICD-10-CM | POA: Diagnosis present

## 2018-09-28 DIAGNOSIS — R29702 NIHSS score 2: Secondary | ICD-10-CM | POA: Diagnosis present

## 2018-09-28 HISTORY — PX: TEE WITHOUT CARDIOVERSION: SHX5443

## 2018-09-28 LAB — C-REACTIVE PROTEIN: CRP: 1.2 mg/dL — ABNORMAL HIGH (ref <1.0)

## 2018-09-28 LAB — HIV ANTIBODY (ROUTINE TESTING W REFLEX): HIV Screen 4th Generation wRfx: NONREACTIVE

## 2018-09-28 SURGERY — ECHOCARDIOGRAM, TRANSESOPHAGEAL
Anesthesia: Moderate Sedation

## 2018-09-28 MED ORDER — MIDAZOLAM HCL 5 MG/5ML IJ SOLN
INTRAMUSCULAR | Status: AC
  Filled 2018-09-28: qty 10

## 2018-09-28 MED ORDER — FENTANYL CITRATE (PF) 100 MCG/2ML IJ SOLN
INTRAMUSCULAR | Status: DC | PRN
  Administered 2018-09-28 (×2): 25 ug via INTRAVENOUS
  Administered 2018-09-28: 50 ug via INTRAVENOUS

## 2018-09-28 MED ORDER — LIDOCAINE VISCOUS HCL 2 % MT SOLN
OROMUCOSAL | Status: DC | PRN
  Administered 2018-09-28: 4 mL via OROMUCOSAL

## 2018-09-28 MED ORDER — MIDAZOLAM HCL 5 MG/5ML IJ SOLN
INTRAMUSCULAR | Status: DC | PRN
  Administered 2018-09-28 (×3): 2 mg via INTRAVENOUS

## 2018-09-28 MED ORDER — BUTAMBEN-TETRACAINE-BENZOCAINE 2-2-14 % EX AERO
INHALATION_SPRAY | CUTANEOUS | Status: AC
  Administered 2018-09-28: 11:00:00
  Filled 2018-09-28: qty 5

## 2018-09-28 MED ORDER — FENTANYL CITRATE (PF) 100 MCG/2ML IJ SOLN
INTRAMUSCULAR | Status: AC
  Administered 2018-09-28: 11:00:00
  Filled 2018-09-28: qty 2

## 2018-09-28 MED ORDER — ATORVASTATIN CALCIUM 40 MG PO TABS: 40.0000 mg | ORAL_TABLET | Freq: Every day | ORAL | 0 refills | Status: AC

## 2018-09-28 MED ORDER — ASPIRIN 325 MG PO TABS: 325.0000 mg | ORAL_TABLET | Freq: Every day | ORAL | 0 refills | Status: AC

## 2018-09-28 MED ORDER — SODIUM CHLORIDE 0.9 % IV SOLN
INTRAVENOUS | Status: DC
  Administered 2018-09-28: 09:00:00 via INTRAVENOUS

## 2018-09-28 MED ORDER — BUTAMBEN-TETRACAINE-BENZOCAINE 2-2-14 % EX AERO
INHALATION_SPRAY | CUTANEOUS | Status: DC | PRN
  Administered 2018-09-28: 2 via TOPICAL

## 2018-09-28 MED ORDER — SODIUM CHLORIDE BACTERIOSTATIC 0.9 % IJ SOLN
INTRAMUSCULAR | Status: AC
  Administered 2018-09-28: 11:00:00
  Filled 2018-09-28: qty 20

## 2018-09-28 MED ORDER — LIDOCAINE VISCOUS HCL 2 % MT SOLN
OROMUCOSAL | Status: AC
  Administered 2018-09-28: 11:00:00
  Filled 2018-09-28: qty 15

## 2018-09-28 NOTE — Plan of Care (Addendum)
Dr. Ovidio Kin at bedside this evening. Discussed TIA, Cardiac consult in the am with concern for Afib.Anticoagulants discussed. Patient verbalized understanding. Addendum for 09/27/18 on admit: Patient  reported a delay in seeking hospital care after symptoms presented. Discussed importance of calling 911 with verbal understanding.

## 2018-09-28 NOTE — Progress Notes (Signed)
*  PRELIMINARY RESULTS* Echocardiogram Echocardiogram Transesophageal has been performed.  Kristie Sweeney 09/28/2018, 11:23 AM

## 2018-09-28 NOTE — CV Procedure (Signed)
Transesophageal Echocardiogram: Indication: CVA Sedation: Versed: 2 mg, Fentanyl: 100 mcg  Procedure:  The patient was moderately sedated with the above doses of versed and fentanyl.  Using digital technique an omniplane probe was advanced into the distal esophagus without incident. Transgastric imaging revealed normal LV function with no RWMA;s and no mural apical thrombus.  Small PFO noted with agitated saline contrast study.  Estimated ejection fraction was 60-65%.  Right sided cardiac chambers were normal with no evidence of pulmonary hypertension. Full report in [facility].   Prentice Docker 09/28/2018 11:41 AM

## 2018-09-28 NOTE — Progress Notes (Signed)
Removed IV-clean, dry, intact. Reviewed d/c paperwork with patient. Reviewed stroke S&S-BEFAST and prevention. Reviewed new medications. Answered all questions. Patient needed to wait until blood was drawn to leave. Then she was wheeled to short stay entrance where she was picked up by husband to d/c to home.

## 2018-09-28 NOTE — Discharge Summary (Signed)
Physician Discharge Summary  Kristie CapersVirginia Sweeney ZOX:[id number] DOB: Jan 16, 1971 DOA: 09/26/2018  PCP: Patient, No Pcp Per  Admit date: 09/26/2018 Discharge date: 09/28/2018  Admitted From: Home Disposition: Home  Recommendations for Outpatient Follow-up:  1. Follow up with PCP in 1-2 weeks 2. Please obtain BMP/CBC in one week 3. Patient will follow-up with neurology in the next 2 weeks. 4. Patient has been referred to the cardiology clinic for 30-day event monitor 5. Hypercoagulable panel pending at the time of discharge and was followed by neurology  Home Health: Equipment/Devices:  Discharge Condition: Stable CODE STATUS: Full code Diet recommendation: Heart healthy  Brief/Interim Summary: 48 year old female with history of TIA approximately 8 years ago, presents to the hospital with slurring of speech and right-sided weakness/numbness.  MRI brain confirmed left-sided CVA.  She was admitted for further stroke work-up.  Discharge Diagnoses:  Principal Problem:   CVA (cerebral vascular accident) (HCC) Active Problems:   Obesity (BMI 30-39.9)   Hyperlipidemia   TIA (transient ischemic attack)  1. Acute CVA.  MRI brain showed multiple small strokes involving the left frontal region.  She was seen by neurology who had high suspicion for cardioembolic phenomenon.  Carotid Dopplers and 2D echocardiogram were found to be unrevealing.  She underwent TEE with bubble study did show small PFO.  No evidence of cardiac thrombus.  She underwent lower extremity Dopplers that did not show any evidence of DVT.  Per neurology, she has been referred for a 30-day event monitor and she will continue on a full dose aspirin.  LDL was noted to be above goal she was started on a statin.  Hypercoagulable panel was sent prior to discharge and this will be followed up by neurology.  She will also follow-up with neurology regarding further management of PFO.  Discharge Instructions  Discharge Instructions    Diet  - low sodium heart healthy   Complete by:  As directed    Increase activity slowly   Complete by:  As directed      Allergies as of 09/28/2018   No Known Allergies     Medication List    STOP taking these medications   ibuprofen 200 MG tablet Commonly known as:  ADVIL,MOTRIN     TAKE these medications   aspirin 325 MG tablet Take 1 tablet (325 mg total) by mouth daily.   atorvastatin 40 MG tablet Commonly known as:  LIPITOR Take 1 tablet (40 mg total) by mouth daily at 6 PM.      Follow-up Information    Beryle Beamsoonquah, Kofi, MD. Schedule an appointment as soon as possible for a visit in 2 week(s).   Specialty:  Neurology Why:  follow up for stroke, hypercoaguable panel and further management of patent foramen ovale Contact information: [address] [place] [facility] [place] [id number] [phone]        cardiology clinic will contact you about mailing you a 30 day heart monitor that you will wear and then return to the cardiology clinic Follow up.          No Known Allergies  Consultations:  Neurology  Cardiology   Procedures/Studies: Dg Chest 2 View  Result Date: 09/26/2018 CLINICAL DATA:  Transient ischemic attack. Slight right side facial drooping and numbness and tingling in the right side of the body. EXAM: CHEST - 2 VIEW COMPARISON:  None. FINDINGS: Borderline enlarged cardiac silhouette. Clear lungs with normal vascularity. Unremarkable bones. IMPRESSION: No acute abnormality. Electronically Signed   By: Zada FindersSteven  Reid M.D.  On: 09/26/2018 21:22   Ct Head Wo Contrast  Result Date: 09/26/2018 CLINICAL DATA:  Slurred speech beginning 2230 hours last night. Numbness in the right side of the body. EXAM: CT HEAD WITHOUT CONTRAST TECHNIQUE: Contiguous axial images were obtained from the base of the skull through the vertex without intravenous contrast. COMPARISON:  None. FINDINGS: Brain: The brainstem and cerebellum are normal. Right cerebral hemisphere appears normal.  There is low-density in the subcortical white matter of the left frontal lobe. The overlying cortex appears normal. This could be a recent or old ischemic insult. Possibility of vasogenic edema does exist as well. Consider brain MRI for more accurate evaluation. No sign of hemorrhage, hydrocephalus or extra-axial collection. Vascular: No abnormal vascular finding. Skull: Negative Sinuses/Orbits: Clear/normal Other: None IMPRESSION: Abnormal low-density in the left frontal subcortical white matter. This is nonspecific and could represent an old or recent ischemic insult. Vasogenic edema not completely excluded. Consider MRI for more accurate evaluation. Electronically Signed   By: Paulina Fusi M.D.   On: 09/26/2018 12:23   Mr Maxine Glenn Head Wo Contrast  Result Date: 09/27/2018 CLINICAL DATA:  Slurred speech with right-sided weakness 1 day EXAM: MRI HEAD WITHOUT CONTRAST MRA HEAD WITHOUT CONTRAST TECHNIQUE: Multiplanar, multiecho pulse sequences of the brain and surrounding structures were obtained without intravenous contrast. Angiographic images of the head were obtained using MRA technique without contrast. COMPARISON:  CT head 09/26/2018 FINDINGS: MRI HEAD FINDINGS Brain: Several small areas of acute infarct in the left frontal lobe involving Broca's area and extending superiorly. This is separate from the hypodensity in the left frontal white matter on CT which is consistent with adjacent area of chronic ischemia. Negative for hemorrhage or mass. Small chronic infarct in the right posterior frontal lobe and right frontal white matter. Brainstem and cerebellum normal. Vascular: Normal arterial flow void Skull and upper cervical spine: Negative Sinuses/Orbits: Mild mucosal edema paranasal sinuses.  Normal orbit Other: None MRA HEAD FINDINGS Both vertebral arteries widely patent. Basilar widely patent. Left PICA patent. Dominant right AICA patent. Superior cerebellar and posterior cerebral arteries patent bilaterally.  Internal carotid artery widely patent bilaterally. Anterior and middle cerebral arteries widely patent bilaterally. Negative for cerebral aneurysm. IMPRESSION: Scattered small areas of acute infarct in the left frontal lobe involving Broca's area. Mild chronic ischemia elsewhere. Negative for hemorrhage Negative MRA head Electronically Signed   By: Marlan Palau M.D.   On: 09/27/2018 09:37   Mr Brain Wo Contrast  Result Date: 09/27/2018 CLINICAL DATA:  Slurred speech with right-sided weakness 1 day EXAM: MRI HEAD WITHOUT CONTRAST MRA HEAD WITHOUT CONTRAST TECHNIQUE: Multiplanar, multiecho pulse sequences of the brain and surrounding structures were obtained without intravenous contrast. Angiographic images of the head were obtained using MRA technique without contrast. COMPARISON:  CT head 09/26/2018 FINDINGS: MRI HEAD FINDINGS Brain: Several small areas of acute infarct in the left frontal lobe involving Broca's area and extending superiorly. This is separate from the hypodensity in the left frontal white matter on CT which is consistent with adjacent area of chronic ischemia. Negative for hemorrhage or mass. Small chronic infarct in the right posterior frontal lobe and right frontal white matter. Brainstem and cerebellum normal. Vascular: Normal arterial flow void Skull and upper cervical spine: Negative Sinuses/Orbits: Mild mucosal edema paranasal sinuses.  Normal orbit Other: None MRA HEAD FINDINGS Both vertebral arteries widely patent. Basilar widely patent. Left PICA patent. Dominant right AICA patent. Superior cerebellar and posterior cerebral arteries patent bilaterally. Internal carotid artery widely patent bilaterally.  Anterior and middle cerebral arteries widely patent bilaterally. Negative for cerebral aneurysm. IMPRESSION: Scattered small areas of acute infarct in the left frontal lobe involving Broca's area. Mild chronic ischemia elsewhere. Negative for hemorrhage Negative MRA head Electronically  Signed   By: Marlan Palau M.D.   On: 09/27/2018 09:37   US Carotid Bilateral (at [facility] And [facility] Only)  Result Date: 09/27/2018 CLINICAL DATA:  Small areas of acute infarct in left frontal lobe based on recent MRI. Slurred speech and right-sided weakness. EXAM: BILATERAL CAROTID DUPLEX ULTRASOUND TECHNIQUE: Wallace Cullens scale imaging, color Doppler and duplex ultrasound were performed of bilateral carotid and vertebral arteries in the neck. COMPARISON:  Brain MRI 09/27/2018 FINDINGS: Criteria: Quantification of carotid stenosis is based on velocity parameters that correlate the residual internal carotid diameter with NASCET-based stenosis levels, using the diameter of the distal internal carotid lumen as the denominator for stenosis measurement. The following velocity measurements were obtained: RIGHT ICA: 111 cm/sec CCA: 97 cm/sec SYSTOLIC ICA/CCA RATIO:  1.2 ECA: 74 cm/sec LEFT ICA: 126 cm/sec CCA: 121 cm/sec SYSTOLIC ICA/CCA RATIO:  1.0 ECA: 78 cm/sec RIGHT CAROTID ARTERY: Right carotid arteries are patent without significant plaque or stenosis. External carotid artery is patent with normal waveform. Normal waveforms and velocities in the internal carotid artery. RIGHT VERTEBRAL ARTERY: Antegrade flow and normal waveform in the right vertebral artery. LEFT CAROTID ARTERY: Left carotid arteries are patent without significant plaque or stenosis. External carotid artery is patent with normal waveform. Normal waveforms and velocities in the internal carotid artery. LEFT VERTEBRAL ARTERY: Antegrade flow and normal waveform in the left vertebral artery. IMPRESSION: Normal carotid artery duplex examination. Carotid arteries are patent without significant plaque or stenosis. Patent vertebral arteries with antegrade flow. Electronically Signed   By: Richarda Overlie M.D.   On: 09/27/2018 10:17   US Venous Img Lower Bilateral  Result Date: 09/28/2018 CLINICAL DATA:  48 year old female with a history of stroke and a diagnosis of  PFO EXAM: BILATERAL LOWER EXTREMITY VENOUS DOPPLER ULTRASOUND TECHNIQUE: Gray-scale sonography with graded compression, as well as color Doppler and duplex ultrasound were performed to evaluate the lower extremity deep venous systems from the level of the common femoral vein and including the common femoral, femoral, profunda femoral, popliteal and calf veins including the posterior tibial, peroneal and gastrocnemius veins when visible. The superficial great saphenous vein was also interrogated. Spectral Doppler was utilized to evaluate flow at rest and with distal augmentation maneuvers in the common femoral, femoral and popliteal veins. COMPARISON:  None. FINDINGS: RIGHT LOWER EXTREMITY Common Femoral Vein: No evidence of thrombus. Normal compressibility, respiratory phasicity and response to augmentation. Saphenofemoral Junction: No evidence of thrombus. Normal compressibility and flow on color Doppler imaging. Profunda Femoral Vein: No evidence of thrombus. Normal compressibility and flow on color Doppler imaging. Femoral Vein: No evidence of thrombus. Normal compressibility, respiratory phasicity and response to augmentation. Popliteal Vein: No evidence of thrombus. Normal compressibility, respiratory phasicity and response to augmentation. Calf Veins: No evidence of thrombus. Normal compressibility and flow on color Doppler imaging. Superficial Great Saphenous Vein: No evidence of thrombus. Normal compressibility and flow on color Doppler imaging. Other Findings:  None. LEFT LOWER EXTREMITY Common Femoral Vein: No evidence of thrombus. Normal compressibility, respiratory phasicity and response to augmentation. Saphenofemoral Junction: No evidence of thrombus. Normal compressibility and flow on color Doppler imaging. Profunda Femoral Vein: No evidence of thrombus. Normal compressibility and flow on color Doppler imaging. Femoral Vein: No evidence of thrombus. Normal compressibility, respiratory phasicity and  response to augmentation. Popliteal Vein: No evidence of thrombus. Normal compressibility, respiratory phasicity and response to augmentation. Calf Veins: No evidence of thrombus. Normal compressibility and flow on color Doppler imaging. Superficial Great Saphenous Vein: No evidence of thrombus. Normal compressibility and flow on color Doppler imaging. Other Findings:  None. IMPRESSION: Sonographic survey of the bilateral lower extremities negative for DVT Electronically Signed   By: Gilmer Mor D.O.   On: 09/28/2018 13:48       Subjective: Feeling better today. No numbness and tingling  Discharge Exam: Vitals:   09/28/18 1040 09/28/18 1045 09/28/18 1130 09/28/18 1530  BP: 119/71 109/72 108/72 123/71  Pulse: 90 89 93 100  Resp: 10 10 13 13   Temp:   97.8 F (36.6 C) (!) 97.5 F (36.4 C)  TempSrc:   Oral Oral  SpO2: 96% 98% 98% 98%  Weight:      Height:        General: Pt is alert, awake, not in acute distress Cardiovascular: RRR, S1/S2 +, no rubs, no gallops Respiratory: CTA bilaterally, no wheezing, no rhonchi Abdominal: Soft, NT, ND, bowel sounds + Extremities: no edema, no cyanosis    The results of significant diagnostics from this hospitalization (including imaging, microbiology, ancillary and laboratory) are listed below for reference.     Microbiology: No results found for this or any previous visit (from the past 240 hour(s)).   Labs: BNP (last 3 results) No results for input(s): BNP in the last 8760 hours. Basic Metabolic Panel: Recent Labs  Lab 09/26/18 1138  NA 138  K 3.9  CL 107  CO2 23  GLUCOSE 86  BUN 12  CREATININE 0.64  CALCIUM 8.5*   Liver Function Tests: Recent Labs  Lab 09/26/18 1138  AST 17  ALT 14  ALKPHOS 90  BILITOT 0.2*  PROT 7.5  ALBUMIN 3.9   No results for input(s): LIPASE, AMYLASE in the last 168 hours. No results for input(s): AMMONIA in the last 168 hours. CBC: Recent Labs  Lab 09/26/18 1138  WBC 10.4  NEUTROABS  6.6  HGB 12.3  HCT 40.0  MCV 89.7  PLT 417*   Cardiac Enzymes: No results for input(s): CKTOTAL, CKMB, CKMBINDEX, TROPONINI in the last 168 hours. BNP: Invalid input(s): POCBNP CBG: No results for input(s): GLUCAP in the last 168 hours. D-Dimer No results for input(s): DDIMER in the last 72 hours. Hgb A1c Recent Labs    09/27/18 0539  HGBA1C 5.7*   Lipid Profile Recent Labs    09/27/18 0539  CHOL 178  HDL 47  LDLCALC 100*  TRIG 156*  CHOLHDL 3.8   Thyroid function studies Recent Labs    09/26/18 1138  TSH 1.885   Anemia work up Recent Labs    09/27/18 1956  VITAMINB12 314   Urinalysis No results found for: COLORURINE, APPEARANCEUR, LABSPEC, PHURINE, GLUCOSEU, HGBUR, BILIRUBINUR, KETONESUR, PROTEINUR, UROBILINOGEN, NITRITE, LEUKOCYTESUR Sepsis Labs Invalid input(s): PROCALCITONIN,  WBC,  LACTICIDVEN Microbiology No results found for this or any previous visit (from the past 240 hour(s)).   Time coordinating discharge:  SIGNED:   Erick Blinks, MD  [facility] 09/28/2018, 9:33 PM   If 7PM-7AM, please contact night-coverage [email or web address]

## 2018-09-28 NOTE — Progress Notes (Signed)
    [facility] has been requested to perform a transesophageal echocardiogram on Kristie Sweeney for CVA.  After careful review of history and examination, the risks and benefits of transesophageal echocardiogram have been explained including risks of esophageal damage, perforation (1:10,000 risk), bleeding, pharyngeal hematoma as well as other potential complications associated with conscious sedation including aspiration, arrhythmia, respiratory failure and death. Alternatives to treatment were discussed, questions were answered. Patient is willing to proceed.   Labs show Hgb 12.3 and platelets 417. BP stable without the requirement for pressors. Patient has been NPO since midnight. Scheduled at 1000 today with Dr. Purvis Sheffield.    Ellsworth Lennox, PA-C  09/28/2018 8:19 AM

## 2018-09-29 ENCOUNTER — Encounter (HOSPITAL_COMMUNITY): Payer: Self-pay | Admitting: Cardiovascular Disease

## 2018-09-29 LAB — ANTITHROMBIN III: AntiThromb III Func: 91 % (ref 75–120)

## 2018-09-29 LAB — ANTINUCLEAR ANTIBODIES, IFA: ANA Ab, IFA: NEGATIVE

## 2018-09-29 LAB — HOMOCYSTEINE: Homocysteine: 7.6 umol/L (ref 0.0–14.5)

## 2018-09-29 LAB — RPR: RPR Ser Ql: NONREACTIVE

## 2018-09-30 ENCOUNTER — Encounter (INDEPENDENT_AMBULATORY_CARE_PROVIDER_SITE_OTHER): Payer: PRIVATE HEALTH INSURANCE

## 2018-09-30 ENCOUNTER — Other Ambulatory Visit: Payer: Self-pay

## 2018-09-30 DIAGNOSIS — I6310 Cerebral infarction due to embolism of unspecified precerebral artery: Secondary | ICD-10-CM

## 2018-09-30 LAB — PROTEIN C ACTIVITY: Protein C Activity: 124 % (ref 73–180)

## 2018-09-30 LAB — CARDIOLIPIN ANTIBODIES, IGG, IGM, IGA
Anticardiolipin IgG: <9 GPL U/mL (ref 0–14)
Anticardiolipin IgM: <9 MPL U/mL (ref 0–12)

## 2018-09-30 LAB — PROTEIN S, TOTAL: Protein S Ag, Total: 83 % (ref 60–150)

## 2018-09-30 LAB — HOMOCYSTEINE: Homocysteine: 7.4 umol/L (ref 0.0–14.5)

## 2018-09-30 LAB — LUPUS ANTICOAGULANT PANEL
DRVVT: 40.7 s (ref 0.0–47.0)
PTT Lupus Anticoagulant: 30.6 s (ref 0.0–51.9)

## 2018-09-30 LAB — PROTEIN S ACTIVITY: PROTEIN S ACTIVITY: 99 % (ref 63–140)

## 2018-10-01 LAB — BETA-2-GLYCOPROTEIN I ABS, IGG/M/A
Beta-2 Glyco I IgG: <9 GPI IgG units (ref 0–20)
Beta-2-Glycoprotein I IgA: <9 GPI IgA units (ref 0–25)
Beta-2-Glycoprotein I IgM: <9 GPI IgM units (ref 0–32)

## 2018-10-02 LAB — PROTEIN C, TOTAL: Protein C, Total: 102 % (ref 60–150)

## 2018-10-05 LAB — FACTOR 5 LEIDEN

## 2018-10-05 LAB — PROTHROMBIN GENE MUTATION

## 2020-03-31 IMAGING — US BILATERAL CAROTID DUPLEX ULTRASOUND
1 series · 13 of 24 positions shown · non-contrast
Comparison: Brain MRI 09/27/2018

CLINICAL DATA: Small areas of acute infarct in left frontal lobe
based on recent MRI. Slurred speech and right-sided weakness.

EXAM:
BILATERAL CAROTID DUPLEX ULTRASOUND
TECHNIQUE: Gray scale imaging, color Doppler and duplex ultrasound were
performed of bilateral carotid and vertebral arteries in the neck.

[Series 1: bilateral carotid duplex ultrasound · 0.06mm/px · 13 of 74 slices shown]
[im 1/74]
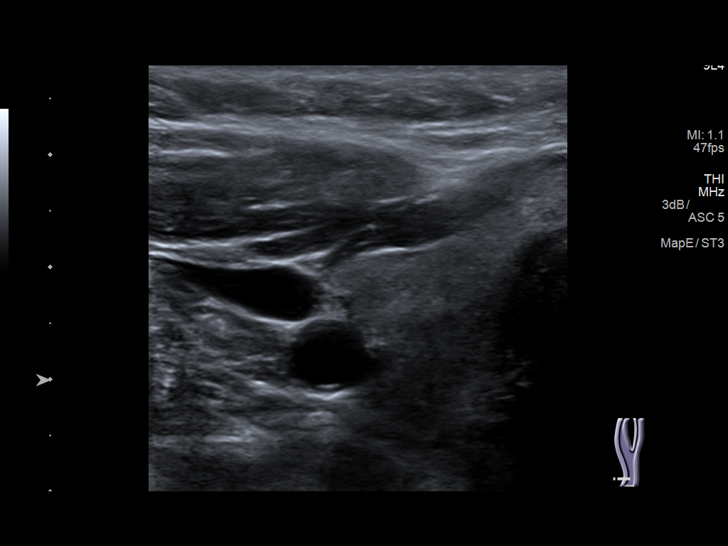
[im 7/74]
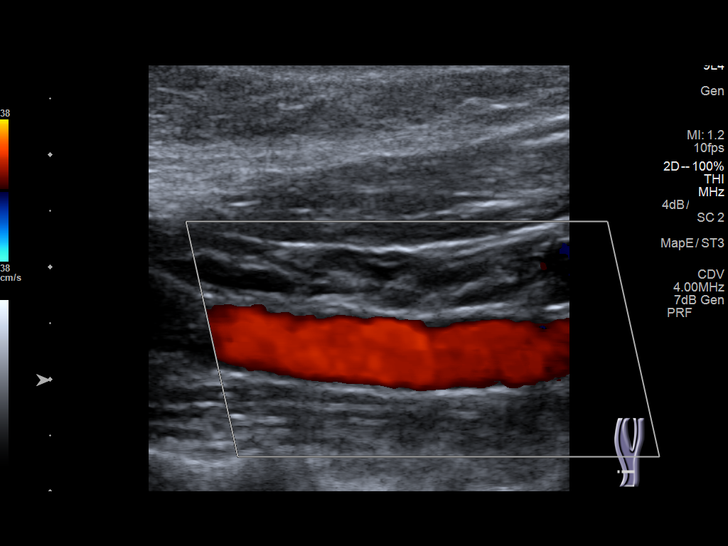
[im 13/74]
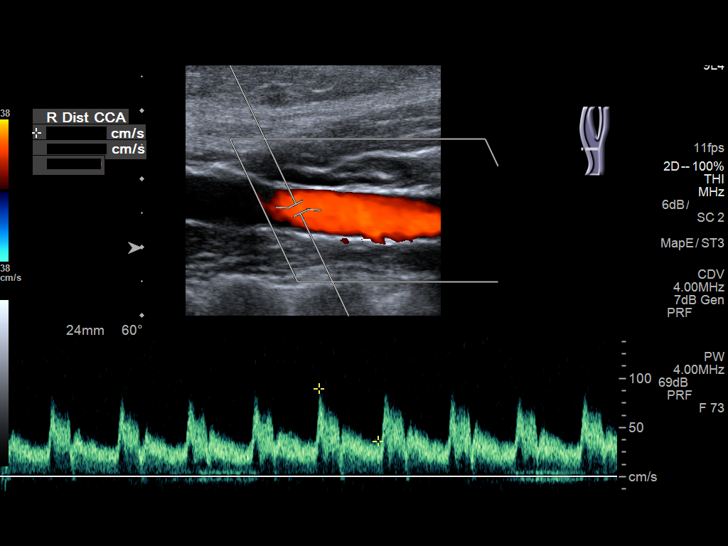
[im 20/74]
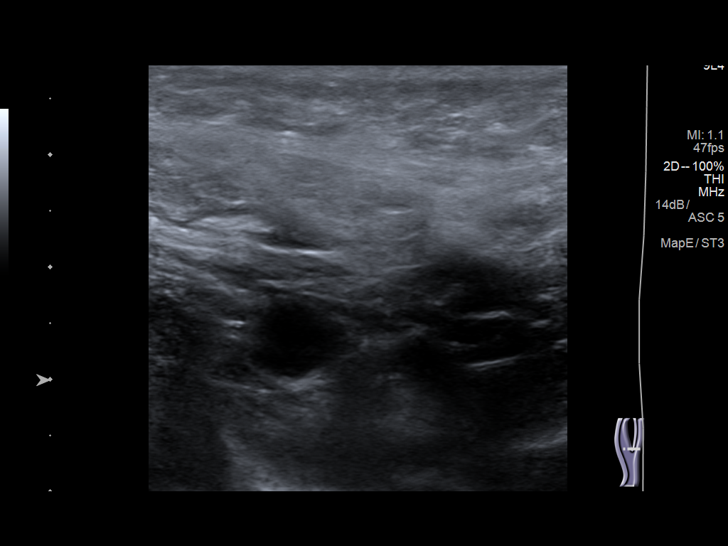
[im 26/74]
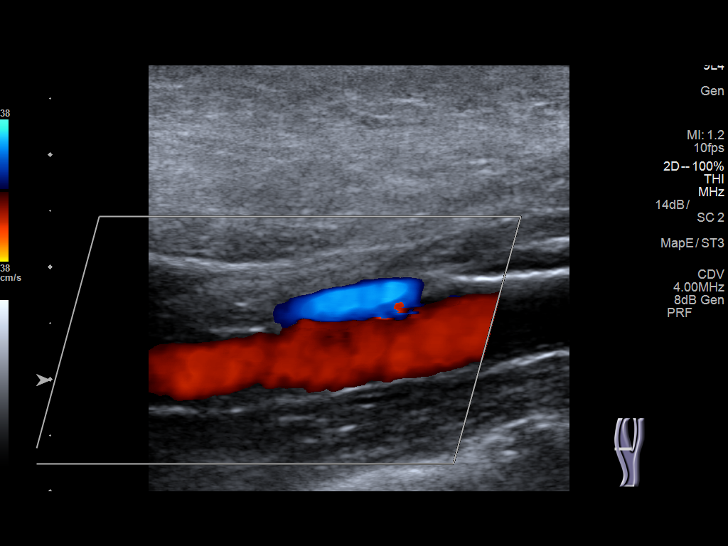
[im 32/74]
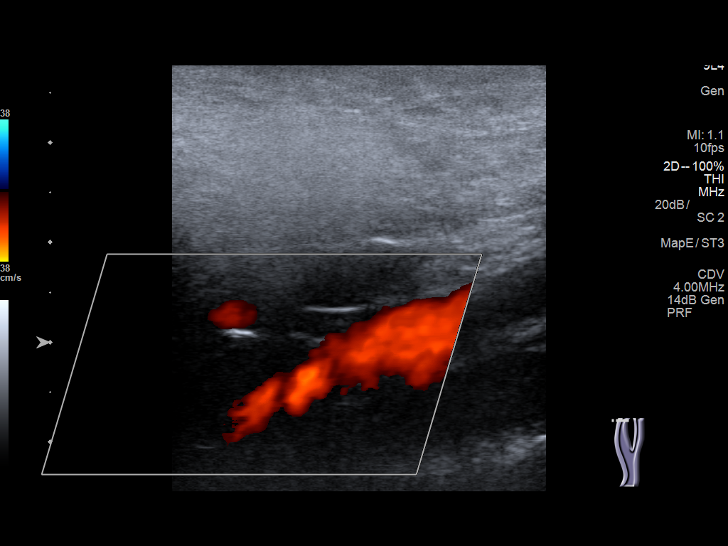
[im 39/74]
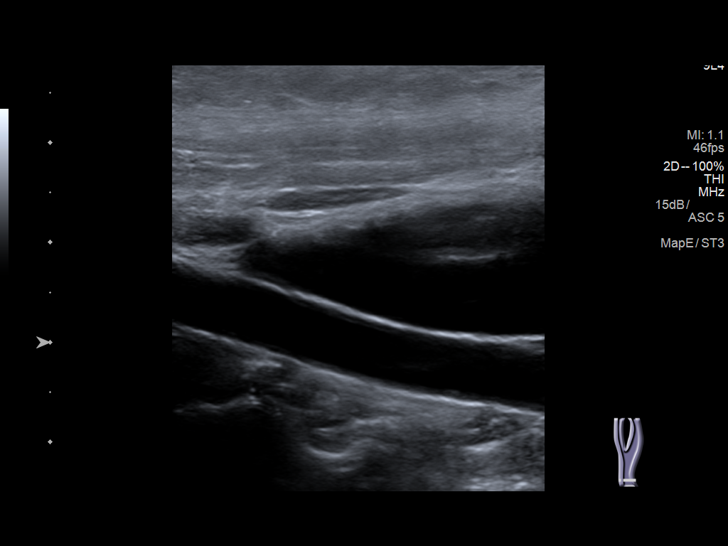
[im 42/74]
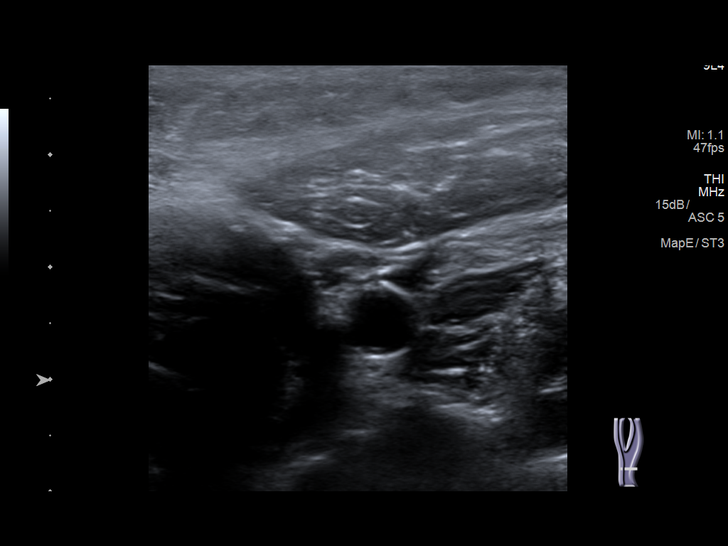
[im 48/74]
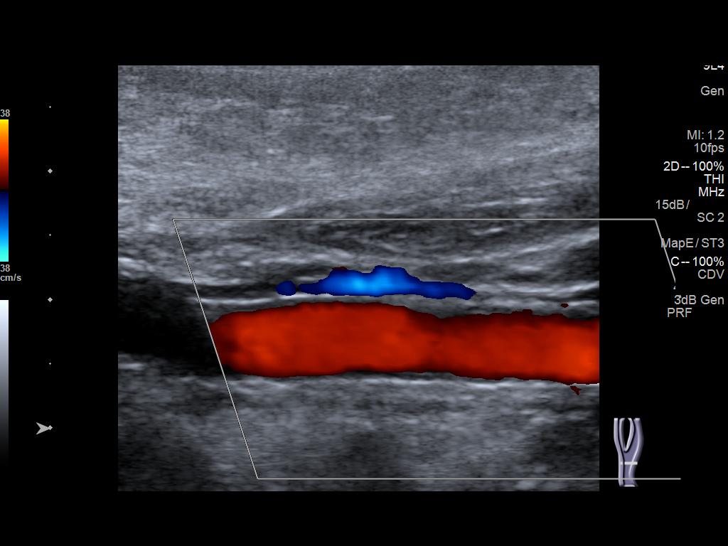
[im 54/74]
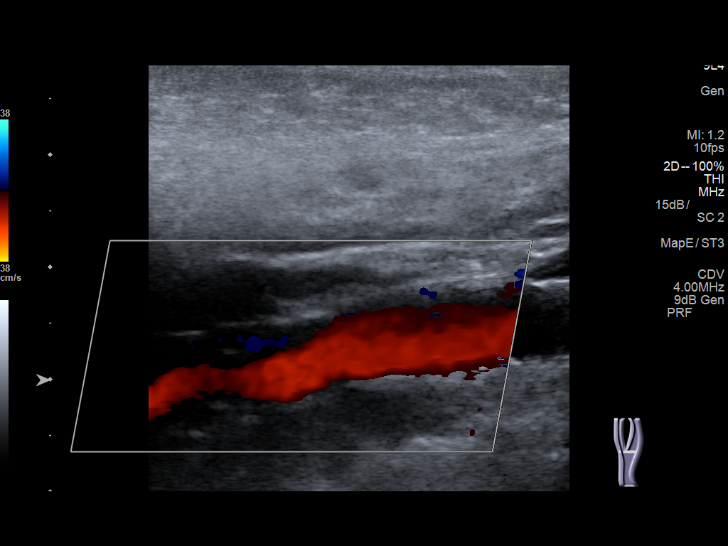
[im 61/74]
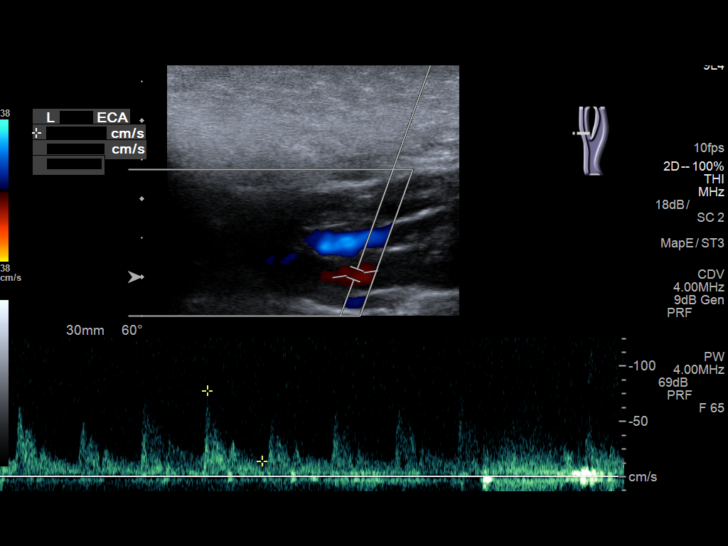
[im 67/74]
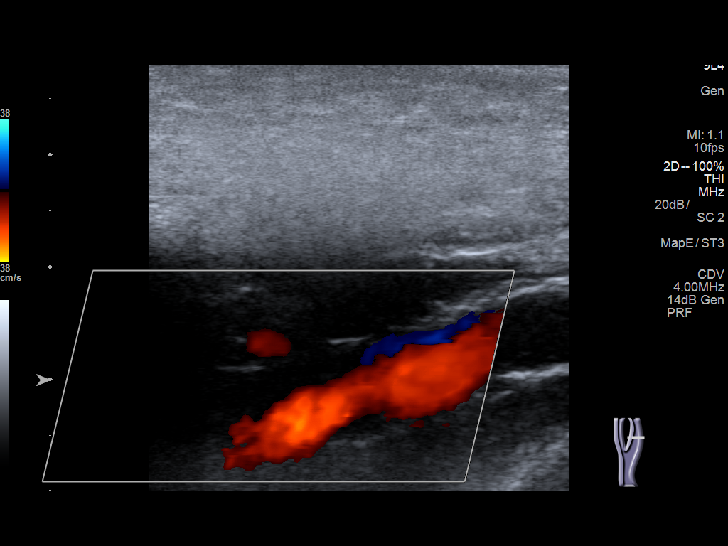
[im 74/74]
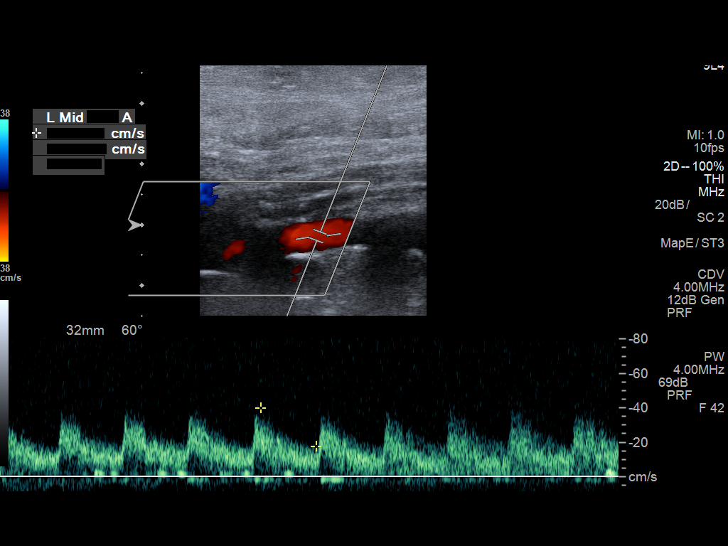

[13 of 24 positions shown; findings below may reference images not displayed]

FINDINGS: Criteria: Quantification of carotid stenosis is based on velocity
parameters that correlate the residual internal carotid diameter
with NASCET-based stenosis levels, using the diameter of the distal
internal carotid lumen as the denominator for stenosis measurement.

The following velocity measurements were obtained:

RIGHT

ICA: 111 cm/sec

CCA: 97 cm/sec

SYSTOLIC ICA/CCA RATIO:

ECA: 74 cm/sec

LEFT

ICA: 126 cm/sec

CCA: 121 cm/sec

SYSTOLIC ICA/CCA RATIO:

ECA: 78 cm/sec

RIGHT CAROTID ARTERY: Right carotid arteries are patent without
significant plaque or stenosis. External carotid artery is patent
with normal waveform. Normal waveforms and velocities in the
internal carotid artery.

RIGHT VERTEBRAL ARTERY: Antegrade flow and normal waveform in the
right vertebral artery.

LEFT CAROTID ARTERY: Left carotid arteries are patent without
significant plaque or stenosis. External carotid artery is patent
with normal waveform. Normal waveforms and velocities in the
internal carotid artery.

LEFT VERTEBRAL ARTERY: Antegrade flow and normal waveform in the
left vertebral artery.
IMPRESSION: Normal carotid artery duplex examination. Carotid arteries are
patent without significant plaque or stenosis.

Patent vertebral arteries with antegrade flow.

## 2020-10-17 IMAGING — DX CHEST - 2 VIEW
2 series · 2 of 2 positions shown · non-contrast
Comparison: None.

CLINICAL DATA: Transient ischemic attack. Slight right side facial
drooping and numbness and tingling in the right side of the body.

EXAM:
CHEST - 2 VIEW

[chest pa]
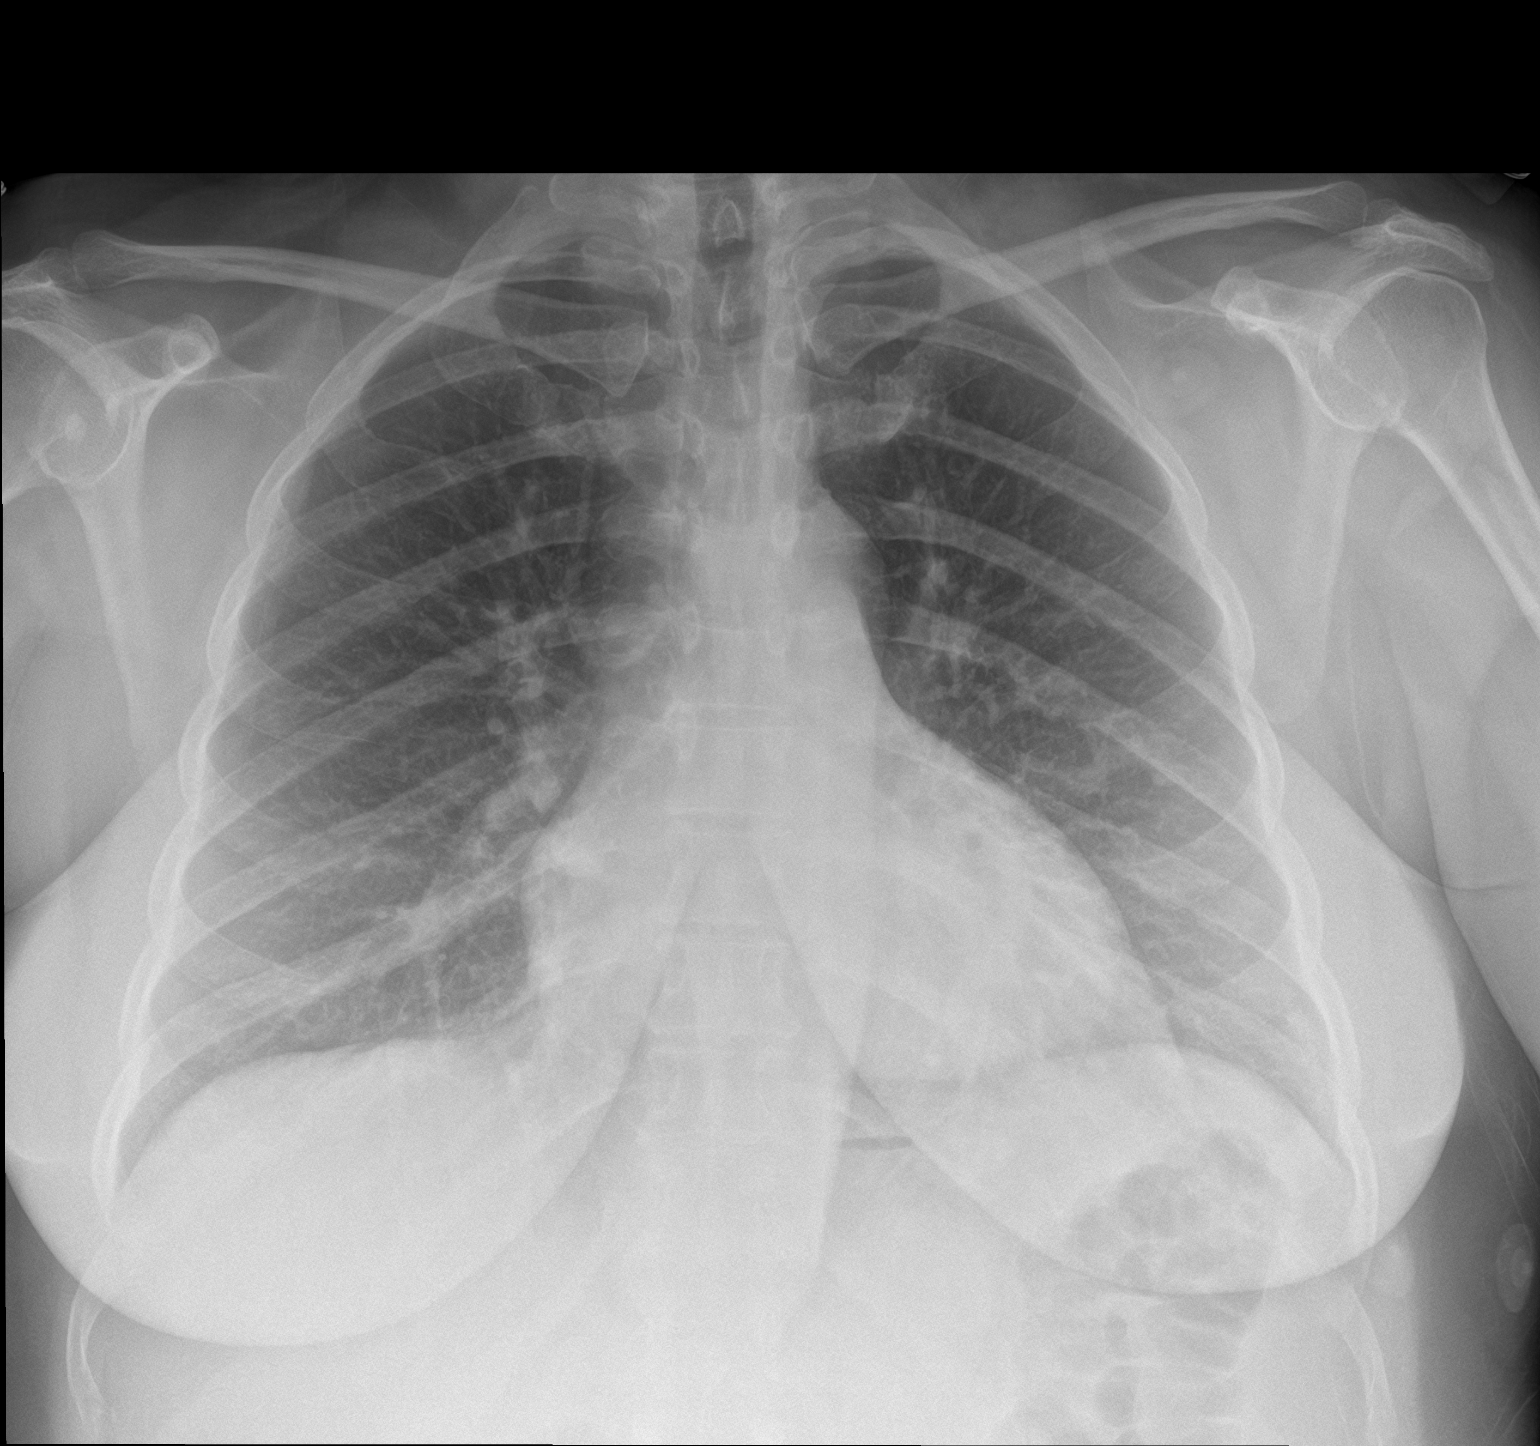

[chest lat]
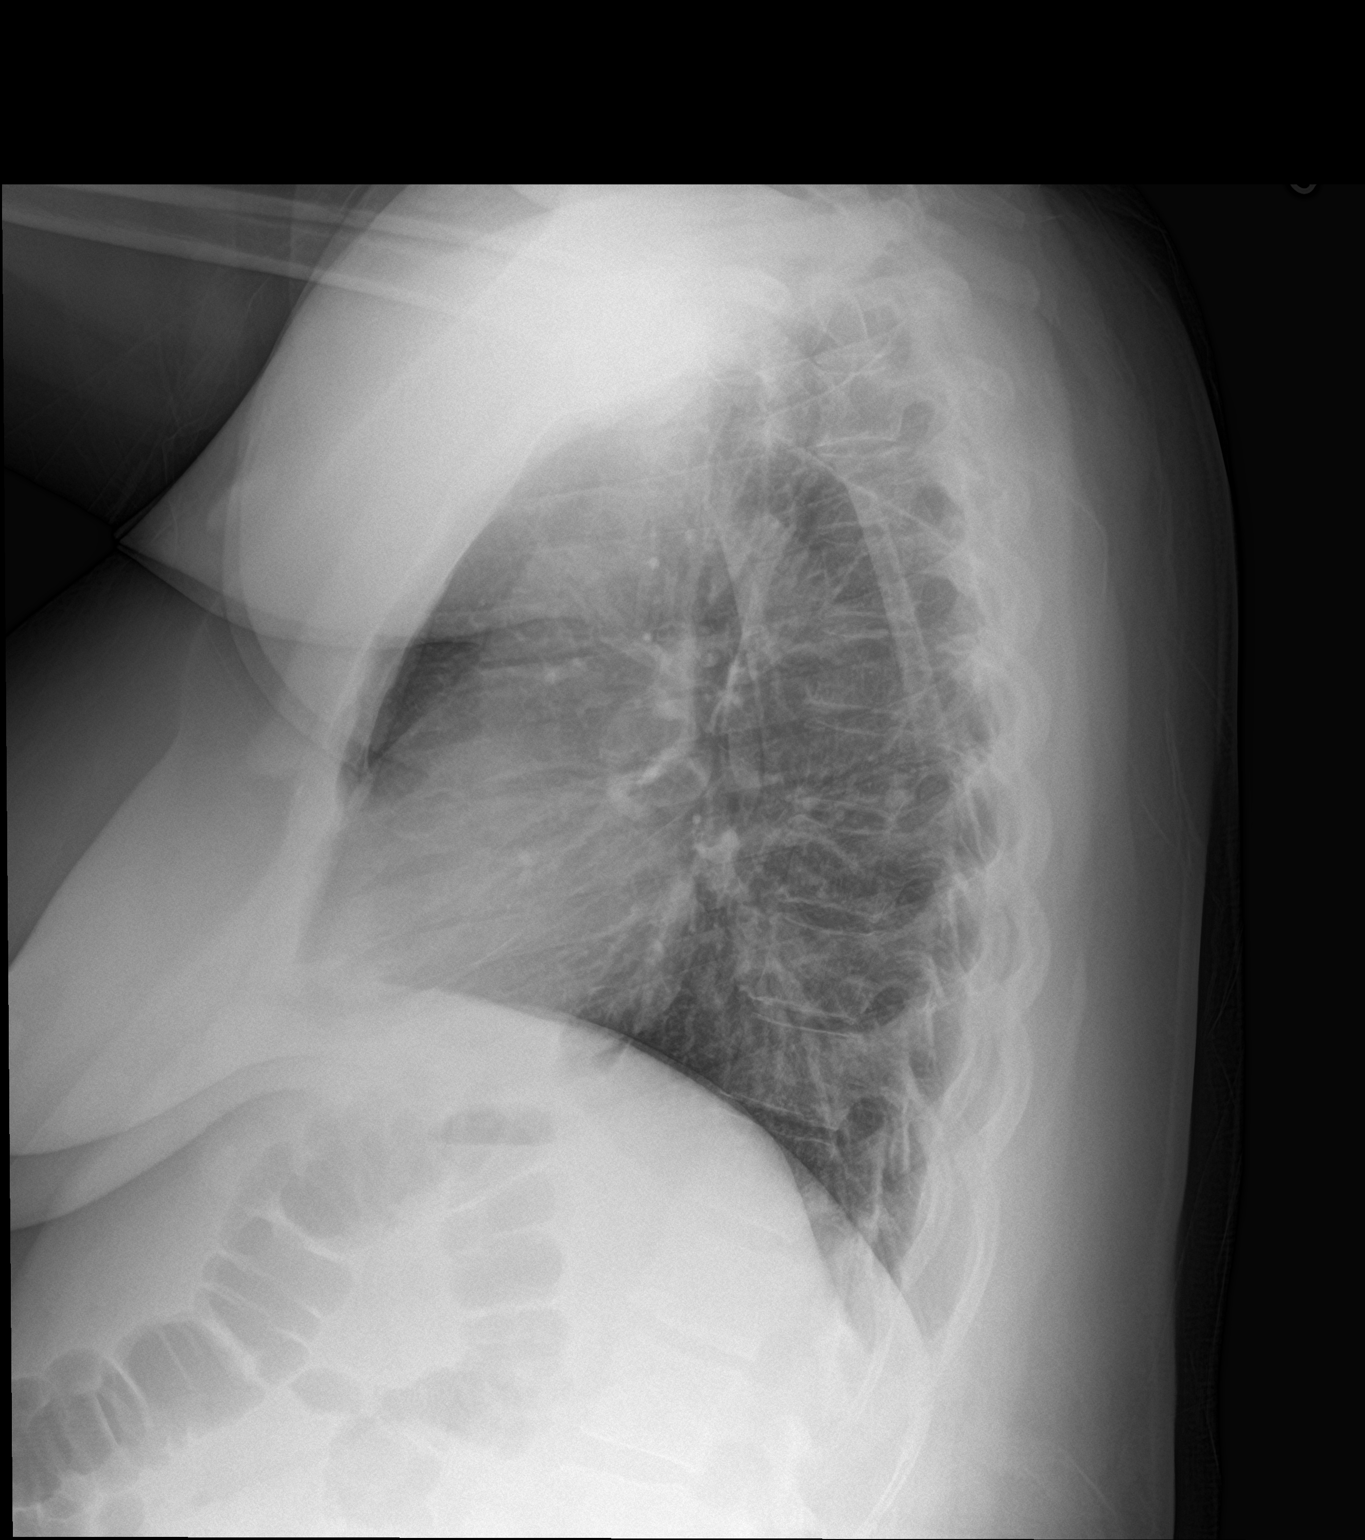

[2 of 2 positions shown; findings below may reference images not displayed]

FINDINGS: Borderline enlarged cardiac silhouette. Clear lungs with normal
vascularity. Unremarkable bones.
IMPRESSION: No acute abnormality.
